# Patient Record
Sex: Male | Born: 1975 | Race: White | Hispanic: No | Marital: Married | Smoking: Never smoker
Health system: Southern US, Community
[De-identification: ages and names within clinical notes are randomized; demographics above are authoritative.]

## PROBLEM LIST (undated history)

## (undated) DIAGNOSIS — I10 Essential (primary) hypertension: Secondary | ICD-10-CM

## (undated) DIAGNOSIS — G4730 Sleep apnea, unspecified: Secondary | ICD-10-CM

## (undated) DIAGNOSIS — G40909 Epilepsy, unspecified, not intractable, without status epilepticus: Secondary | ICD-10-CM

## (undated) HISTORY — DX: Hemochromatosis, unspecified: E83.119

## (undated) HISTORY — DX: Other hemochromatosis: E83.118

## (undated) HISTORY — DX: Essential (primary) hypertension: I10

## (undated) HISTORY — DX: Sleep apnea, unspecified: G47.30

## (undated) HISTORY — DX: Epilepsy, unspecified, not intractable, without status epilepticus: G40.909

## (undated) HISTORY — PX: TONSILLECTOMY: SUR1361

---

## 2019-04-13 ENCOUNTER — Ambulatory Visit: Payer: BC Managed Care – PPO | Admitting: Legal Medicine

## 2019-04-13 ENCOUNTER — Encounter: Payer: Self-pay | Admitting: Legal Medicine

## 2019-04-13 ENCOUNTER — Other Ambulatory Visit: Payer: Self-pay

## 2019-04-13 DIAGNOSIS — R42 Dizziness and giddiness: Secondary | ICD-10-CM | POA: Insufficient documentation

## 2019-04-13 DIAGNOSIS — G4730 Sleep apnea, unspecified: Secondary | ICD-10-CM | POA: Insufficient documentation

## 2019-04-13 DIAGNOSIS — G4733 Obstructive sleep apnea (adult) (pediatric): Secondary | ICD-10-CM | POA: Diagnosis not present

## 2019-04-13 DIAGNOSIS — R072 Precordial pain: Secondary | ICD-10-CM

## 2019-04-13 DIAGNOSIS — R079 Chest pain, unspecified: Secondary | ICD-10-CM

## 2019-04-13 HISTORY — DX: Dizziness and giddiness: R42

## 2019-04-13 HISTORY — DX: Chest pain, unspecified: R07.9

## 2019-04-13 HISTORY — DX: Morbid (severe) obesity due to excess calories: E66.01

## 2019-04-13 MED ORDER — MECLIZINE HCL 25 MG PO TABS: 25.0000 mg | ORAL_TABLET | Freq: Three times a day (TID) | ORAL | 0 refills | Status: DC | PRN

## 2019-04-13 NOTE — Assessment & Plan Note (Signed)
Counseled on detrimental effects of excessive weight on overall health. Counseled to engage in 150 minutes of moderate exercise a week and to reduce caloric intake with a diet of healthy foods that he feels he can be most compliant with. Set goal to reach a weight of 5 pounds in the next one months.

## 2019-04-13 NOTE — Assessment & Plan Note (Signed)
Not been checked in years and may be the cause of his chest pain , fatigue or vertigo.  May need hematology consult.

## 2019-04-13 NOTE — Progress Notes (Signed)
Acute Office Visit  Subjective:    Patient ID: Austin Gilbert, male    DOB: 05/09/1975, 44 y.o.   MRN: [id number]  Chief Complaint  Patient presents with  . Dizziness  . Hypertension    reported blood pressure of 137/95    Dizziness  Hypertension   Patient is in today for vertigo.  Vertigo started off and on for weeks. No tinnitus.  Fell down stairs once.  No  Illness.  No allergies or fever or chills.  Patient presents for follow up of hypertension.  Patient tolerating no well with side effects.  Patient was diagnosed with hypertension few days ago so has been treated for hypertension for < 1 years.Patient is working on maintaining diet and exercise regimen and follows up as directed. BP ws going up and down over week end.  Hemachromatosis not check in long time.  Past Medical History:  Diagnosis Date  . Epilepsy ([place])   . Hemochromatosis     History reviewed. No pertinent surgical history.  Family History  Problem Relation Age of Onset  . Heart disease Mother   . Hypertension Mother   . Hyperlipidemia Mother   . Transient ischemic attack Mother   . COPD Father   . Testicular cancer Brother     Social History   Socioeconomic History  . Marital status: Married    Spouse name: Not on file  . Number of children: Not on file  . Years of education: Not on file  . Highest education level: Not on file  Occupational History    Employer: [employer]  Tobacco Use  . Smoking status: Never Smoker  . Smokeless tobacco: Never Used  Substance and Sexual Activity  . Alcohol use: Not Currently  . Drug use: Not Currently    Types: Marijuana    Comment: Experimented with  . Sexual activity: Not on file  Other Topics Concern  . Not on file  Social History Narrative  . Not on file   Social Determinants of Health   Financial Resource Strain:   . Difficulty of Paying Living Expenses: Not on file  Food Insecurity:   . Worried About Charity fundraiser in the  Last Year: Not on file  . Ran Out of Food in the Last Year: Not on file  Transportation Needs:   . Lack of Transportation (Medical): Not on file  . Lack of Transportation (Non-Medical): Not on file  Physical Activity:   . Days of Exercise per Week: Not on file  . Minutes of Exercise per Session: Not on file  Stress:   . Feeling of Stress : Not on file  Social Connections:   . Frequency of Communication with Friends and Family: Not on file  . Frequency of Social Gatherings with Friends and Family: Not on file  . Attends Religious Services: Not on file  . Active Member of Clubs or Organizations: Not on file  . Attends Archivist Meetings: Not on file  . Marital Status: Not on file  Intimate Partner Violence:   . Fear of Current or Ex-Partner: Not on file  . Emotionally Abused: Not on file  . Physically Abused: Not on file  . Sexually Abused: Not on file    Outpatient Medications Prior to Visit  Medication Sig Dispense Refill  . Ascorbic Acid (VITA-C PO) Take by mouth.    . cetirizine (ZYRTEC) 10 MG chewable tablet Chew 10 mg by mouth daily.    . Multiple Vitamin (  MULTIVITAMIN) tablet Take 1 tablet by mouth daily.    . Multiple Vitamins-Minerals (ZINC PO) Take by mouth.     No facility-administered medications prior to visit.    No Known Allergies  Review of Systems  Constitutional: Negative.   HENT: Negative.   Eyes: Negative.   Respiratory: Negative.   Cardiovascular: Negative.   Gastrointestinal: Negative.   Endocrine: Negative.   Genitourinary: Negative.   Musculoskeletal: Negative.   Neurological: Positive for dizziness.       Objective:    Physical Exam Constitutional:      Appearance: Normal appearance. He is obese.  HENT:     Head: Normocephalic and atraumatic.     Comments: Nystagmus to right Cardiovascular:     Rate and Rhythm: Normal rate and regular rhythm.     Pulses: Normal pulses.     Heart sounds: Normal heart sounds.  Pulmonary:      Effort: Pulmonary effort is normal.     Breath sounds: Normal breath sounds.  Musculoskeletal:        General: Normal range of motion.     Cervical back: Normal range of motion.  Neurological:     Mental Status: He is alert.     Coordination: Romberg sign positive. Finger-Nose-Finger Test normal.     BP 104/60   Pulse 81   Temp 98.2 F (36.8 C)   Resp 18   Ht '5\' 7"'$  (1.702 m)   Wt 297 lb (134.7 kg)   SpO2 98%   BMI 46.52 kg/m  Wt Readings from Last 3 Encounters:  04/13/19 297 lb (134.7 kg)    Health Maintenance Due  Topic Date Due  . HIV Screening  05/03/1988  . TETANUS/TDAP  05/03/1992  . INFLUENZA VACCINE  10/03/2018    There are no preventive care reminders to display for this patient.   No results found for: TSH No results found for: WBC, HGB, HCT, MCV, PLT No results found for: NA, K, CHLORIDE, CO2, GLUCOSE, BUN, CREATININE, BILITOT, ALKPHOS, AST, ALT, PROT, ALBUMIN, CALCIUM, ANIONGAP, EGFR, GFR No results found for: CHOL No results found for: HDL No results found for: LDLCALC No results found for: TRIG No results found for: CHOLHDL No results found for: HGBA1C     Assessment & Plan:   Problem List Items Addressed This Visit      Respiratory   OSA (obstructive sleep apnea)    He has been on CPAP but does not use it regualrly.  He wakes up sleepy and still snores at night.  He requires recheck for appropriate pressures since he has gained weight. Refer to respiratory.      Relevant Orders   Ambulatory referral to Pulmonology     Other   Hemochromatosis    Not been checked in years and may be the cause of his chest pain , fatigue or vertigo.  May need hematology consult.      Relevant Orders   CBC with Differential   Ferritin   Morbid obesity ([place])    Counseled on detrimental effects of excessive weight on overall health. Counseled to engage in 150 minutes of moderate exercise a week and to reduce caloric intake with a diet of healthy foods that  he feels he can be most compliant with. Set goal to reach a weight of 5 pounds in the next one months.       Vertigo    Patient is having vertigo and fluctuation BP.  Positive nystagmus.  Try antevert for now.  Relevant Medications   meclizine (ANTIVERT) 25 MG tablet   Other Relevant Orders   Novel Coronavirus, NAA ([facility])   Chest pain    Patient gets chest discomfort with working and exercising.  No sweats or radiation.  It makes him stop working out.  He needs clearance before endeavoring a formal exercise program.      Relevant Orders   Comprehensive metabolic panel   Lipid Panel       Meds ordered this encounter  Medications  . meclizine (ANTIVERT) 25 MG tablet    Sig: Take 1 tablet (25 mg total) by mouth 3 (three) times daily as needed for dizziness.    Dispense:  30 tablet    Refill:  0     Reinaldo Meeker, MD

## 2019-04-13 NOTE — Assessment & Plan Note (Signed)
Patient is having vertigo and fluctuation BP.  Positive nystagmus.  Try antevert for now.

## 2019-04-13 NOTE — Assessment & Plan Note (Signed)
Patient gets chest discomfort with working and exercising.  No sweats or radiation.  It makes him stop working out.  He needs clearance before endeavoring a formal exercise program.

## 2019-04-13 NOTE — Patient Instructions (Signed)
Fat and Cholesterol Restricted Eating Plan Getting too much fat and cholesterol in your diet may cause health problems. Choosing the right foods helps keep your fat and cholesterol at normal levels. This can keep you from getting certain diseases. Your doctor may recommend an eating plan that includes:  Total fat: ______% or less of total calories a day.  Saturated fat: ______% or less of total calories a day.  Cholesterol: less than _________mg a day.  Fiber: ______g a day. What are tips for following this plan? Meal planning  At meals, divide your plate into four equal parts: ? Fill one-half of your plate with vegetables and green salads. ? Fill one-fourth of your plate with whole grains. ? Fill one-fourth of your plate with low-fat (lean) protein foods.  Eat fish that is high in omega-3 fats at least two times a week. This includes mackerel, tuna, sardines, and salmon.  Eat foods that are high in fiber, such as whole grains, beans, apples, broccoli, carrots, peas, and barley. General tips   Work with your doctor to lose weight if you need to.  Avoid: ? Foods with added sugar. ? Fried foods. ? Foods with partially hydrogenated oils.  Limit alcohol intake to no more than 1 drink a day for nonpregnant women and 2 drinks a day for men. One drink equals 12 oz of beer, 5 oz of wine, or 1 oz of hard liquor. Reading food labels  Check food labels for: ? Trans fats. ? Partially hydrogenated oils. ? Saturated fat (g) in each serving. ? Cholesterol (mg) in each serving. ? Fiber (g) in each serving.  Choose foods with healthy fats, such as: ? Monounsaturated fats. ? Polyunsaturated fats. ? Omega-3 fats.  Choose grain products that have whole grains. Look for the word "whole" as the first word in the ingredient list. Cooking  Cook foods using low-fat methods. These include baking, boiling, grilling, and broiling.  Eat more home-cooked foods. Eat at restaurants and buffets  less often.  Avoid cooking using saturated fats, such as butter, cream, palm oil, palm kernel oil, and coconut oil. Recommended foods  Fruits  All fresh, canned (in natural juice), or frozen fruits. Vegetables  Fresh or frozen vegetables (raw, steamed, roasted, or grilled). Green salads. Grains  Whole grains, such as whole wheat or whole grain breads, crackers, cereals, and pasta. Unsweetened oatmeal, bulgur, barley, quinoa, or brown rice. Corn or whole wheat flour tortillas. Meats and other protein foods  Ground beef (85% or leaner), grass-fed beef, or beef trimmed of fat. Skinless chicken or turkey. Ground chicken or turkey. Pork trimmed of fat. All fish and seafood. Egg whites. Dried beans, peas, or lentils. Unsalted nuts or seeds. Unsalted canned beans. Nut butters without added sugar or oil. Dairy  Low-fat or nonfat dairy products, such as skim or 1% milk, 2% or reduced-fat cheeses, low-fat and fat-free ricotta or cottage cheese, or plain low-fat and nonfat yogurt. Fats and oils  Tub margarine without trans fats. Light or reduced-fat mayonnaise and salad dressings. Avocado. Olive, canola, sesame, or safflower oils. The items listed above may not be a complete list of foods and beverages you can eat. Contact a dietitian for more information. Foods to avoid Fruits  Canned fruit in heavy syrup. Fruit in cream or butter sauce. Fried fruit. Vegetables  Vegetables cooked in cheese, cream, or butter sauce. Fried vegetables. Grains  White bread. White pasta. White rice. Cornbread. Bagels, pastries, and croissants. Crackers and snack foods that contain trans fat   and hydrogenated oils. Meats and other protein foods  Fatty cuts of meat. Ribs, chicken wings, bacon, sausage, bologna, salami, chitterlings, fatback, hot dogs, bratwurst, and packaged lunch meats. Liver and organ meats. Whole eggs and egg yolks. Chicken and turkey with skin. Fried meat. Dairy  Whole or 2% milk, cream,  half-and-half, and cream cheese. Whole milk cheeses. Whole-fat or sweetened yogurt. Full-fat cheeses. Nondairy creamers and whipped toppings. Processed cheese, cheese spreads, and cheese curds. Beverages  Alcohol. Sugar-sweetened drinks such as sodas, lemonade, and fruit drinks. Fats and oils  Butter, stick margarine, lard, shortening, ghee, or bacon fat. Coconut, palm kernel, and palm oils. Sweets and desserts  Corn syrup, sugars, honey, and molasses. Candy. Jam and jelly. Syrup. Sweetened cereals. Cookies, pies, cakes, donuts, muffins, and ice cream. The items listed above may not be a complete list of foods and beverages you should avoid. Contact a dietitian for more information. Summary  Choosing the right foods helps keep your fat and cholesterol at normal levels. This can keep you from getting certain diseases.  At meals, fill one-half of your plate with vegetables and green salads.  Eat high-fiber foods, like whole grains, beans, apples, carrots, peas, and barley.  Limit added sugar, saturated fats, alcohol, and fried foods. This information is not intended to replace advice given to you by your health care provider. Make sure you discuss any questions you have with your health care provider. Document Revised: 10/22/2017 Document Reviewed: 11/05/2016 Elsevier Patient Education  2020 Elsevier Inc.  

## 2019-04-13 NOTE — Assessment & Plan Note (Signed)
He has been on CPAP but does not use it regualrly.  He wakes up sleepy and still snores at night.  He requires recheck for appropriate pressures since he has gained weight. Refer to respiratory.

## 2019-04-14 ENCOUNTER — Other Ambulatory Visit: Payer: Self-pay | Admitting: Legal Medicine

## 2019-04-14 LAB — CBC WITH DIFFERENTIAL/PLATELET
Basophils Absolute: 0 10*3/uL (ref 0.0–0.2)
Basos: 0 %
EOS (ABSOLUTE): 0.3 10*3/uL (ref 0.0–0.4)
Eos: 3 %
Hematocrit: 42.1 % (ref 37.5–51.0)
Hemoglobin: 14.6 g/dL (ref 13.0–17.7)
Immature Grans (Abs): 0 10*3/uL (ref 0.0–0.1)
Immature Granulocytes: 0 %
Lymphocytes Absolute: 2.7 10*3/uL (ref 0.7–3.1)
Lymphs: 23 %
MCH: 32.4 pg (ref 26.6–33.0)
MCHC: 34.7 g/dL (ref 31.5–35.7)
MCV: 93 fL (ref 79–97)
Monocytes Absolute: 0.9 10*3/uL (ref 0.1–0.9)
Monocytes: 8 %
Neutrophils Absolute: 7.6 10*3/uL — ABNORMAL HIGH (ref 1.4–7.0)
Neutrophils: 66 %
Platelets: 284 10*3/uL (ref 150–450)
RBC: 4.51 x10E6/uL (ref 4.14–5.80)
RDW: 12.8 % (ref 11.6–15.4)
WBC: 11.6 10*3/uL — ABNORMAL HIGH (ref 3.4–10.8)

## 2019-04-14 LAB — COMPREHENSIVE METABOLIC PANEL
ALT: 36 IU/L (ref 0–44)
AST: 27 IU/L (ref 0–40)
Albumin/Globulin Ratio: 1.7 (ref 1.2–2.2)
Albumin: 4.4 g/dL (ref 4.0–5.0)
Alkaline Phosphatase: 94 IU/L (ref 39–117)
BUN/Creatinine Ratio: 17 (ref 9–20)
BUN: 15 mg/dL (ref 6–24)
Bilirubin Total: 0.2 mg/dL (ref 0.0–1.2)
CO2: 24 mmol/L (ref 20–29)
Calcium: 9.2 mg/dL (ref 8.7–10.2)
Chloride: 102 mmol/L (ref 96–106)
Creatinine, Ser: 0.9 mg/dL (ref 0.76–1.27)
GFR calc Af Amer: 119 mL/min/{1.73_m2} (ref >59)
GFR calc non Af Amer: 103 mL/min/{1.73_m2} (ref >59)
Globulin, Total: 2.6 g/dL (ref 1.5–4.5)
Glucose: 87 mg/dL (ref 65–99)
Potassium: 4.3 mmol/L (ref 3.5–5.2)
Sodium: 138 mmol/L (ref 134–144)
Total Protein: 7 g/dL (ref 6.0–8.5)

## 2019-04-14 LAB — LIPID PANEL
Chol/HDL Ratio: 3.6 ratio (ref 0.0–5.0)
Cholesterol, Total: 153 mg/dL (ref 100–199)
HDL: 42 mg/dL (ref >39)
LDL Chol Calc (NIH): 85 mg/dL (ref 0–99)
Triglycerides: 150 mg/dL — ABNORMAL HIGH (ref 0–149)
VLDL Cholesterol Cal: 26 mg/dL (ref 5–40)

## 2019-04-14 LAB — CARDIOVASCULAR RISK ASSESSMENT

## 2019-04-14 LAB — FERRITIN: Ferritin: 552 ng/mL — ABNORMAL HIGH (ref 30–400)

## 2019-04-14 LAB — NOVEL CORONAVIRUS, NAA: SARS-CoV-2, NAA: NOT DETECTED

## 2019-04-14 NOTE — Progress Notes (Signed)
Wbc high ? Infection, no anemia, ferritin high 552, kidney and liver tests normal, triglycerides are high- low cholesterol diet. Send patient one.  I can send to [facility] for the high storage of iron? lp

## 2019-04-14 NOTE — Progress Notes (Signed)
SARS-CoV-2 is negative lp

## 2019-04-16 ENCOUNTER — Other Ambulatory Visit: Payer: Self-pay

## 2019-04-16 ENCOUNTER — Ambulatory Visit (INDEPENDENT_AMBULATORY_CARE_PROVIDER_SITE_OTHER): Payer: BC Managed Care – PPO | Admitting: Cardiology

## 2019-04-16 ENCOUNTER — Encounter: Payer: Self-pay | Admitting: Cardiology

## 2019-04-16 VITALS — BP 138/88 | HR 78 | Temp 97.3°F | Ht 67.0 in | Wt 295.8 lb

## 2019-04-16 DIAGNOSIS — G4733 Obstructive sleep apnea (adult) (pediatric): Secondary | ICD-10-CM | POA: Diagnosis not present

## 2019-04-16 DIAGNOSIS — R0600 Dyspnea, unspecified: Secondary | ICD-10-CM

## 2019-04-16 DIAGNOSIS — R0609 Other forms of dyspnea: Secondary | ICD-10-CM

## 2019-04-16 DIAGNOSIS — I10 Essential (primary) hypertension: Secondary | ICD-10-CM | POA: Insufficient documentation

## 2019-04-16 HISTORY — DX: Essential (primary) hypertension: I10

## 2019-04-16 HISTORY — DX: Other forms of dyspnea: R06.09

## 2019-04-16 MED ORDER — AMLODIPINE BESYLATE 5 MG PO TABS: 5.0000 mg | ORAL_TABLET | Freq: Every day | ORAL | 12 refills | Status: DC

## 2019-04-16 NOTE — Progress Notes (Signed)
Cardiology Office Note:    Date:  04/16/2019   ID:  Austin Gilbert, DOB [birth year], MRN [id number]  PCP:  Austin Anes, MD  Cardiologist:  Austin Lindau, MD   Referring MD: Austin Gilbert,*    ASSESSMENT:    1. OSA (obstructive sleep apnea)   2. Morbid obesity ([place])   3. Hemochromatosis, unspecified hemochromatosis type   4. Essential hypertension    PLAN:    In order of problems listed above:  1. Primary prevention stressed with the patient.  Importance of compliance with diet and medication stressed and he vocalized understanding 2. Essential hypertension: Blood pressure is elevated and I will initiate him on Norvasc 5 mg daily.  Diet especially salt issues were discussed.  I told him not to exercise very vigorously in the next few days till his blood pressure normalizes 3. Dyspnea on exertion: We will do a Lexiscan sestamibi to see if there is any concern here especially of coronary artery disease. 4. Morbid obesity: Diet was discussed and he promises to do better and lose weight.  Once his blood pressure normalizes and his stress test was fine we will okay him for an exercise protocol. Sleep health issues were discussed with the patient at length Follow-up appointment in a month or earlier if he has any concerns.  He knows to go to the nearest emergency room for any concerning symptoms.   Medication Adjustments/Labs and Tests Ordered: Current medicines are reviewed at length with the patient today.  Concerns regarding medicines are outlined above.  No orders of the defined types were placed in this encounter.  No orders of the defined types were placed in this encounter.    History of Present Illness:    Austin Gilbert is a 44 y.o. male who is being seen today for the evaluation of essential hypertension at the request of Austin Gilbert,*.  Patient is a pleasant 44 year old male.  He has past medical history of morbid obesity, sleep apnea and  hemochromatosis.  He mentions to me that his blood pressure is elevated and he is here for evaluation.  His wife is in the healthcare field and his mother-in-law is a Marine scientist and therefore they are seeking that he take proper care and attention for his hypertension.  He is morbidly obese.  He tries to live an active lifestyle but he is put a hold on his exercise because of elevated blood pressure.  He denies any chest pain orthopnea or PND.  Just some dyspnea on exertion.  At the time of my evaluation, the patient is alert awake oriented and in no distress.  Past Medical History:  Diagnosis Date  . Epilepsy (Austin Gilbert)   . Hemochromatosis     No past surgical history on file.  Current Medications: Current Meds  Medication Sig  . Ascorbic Acid (VITA-C PO) Take by mouth.  . cetirizine (ZYRTEC) 10 MG chewable tablet Chew 10 mg by mouth daily.  . meclizine (ANTIVERT) 25 MG tablet Take 1 tablet (25 mg total) by mouth 3 (three) times daily as needed for dizziness.  . Multiple Vitamin (MULTIVITAMIN) tablet Take 1 tablet by mouth daily.  . Multiple Vitamins-Minerals (ZINC PO) Take by mouth.     Allergies:   Patient has no known allergies.   Social History   Socioeconomic History  . Marital status: Married    Spouse name: Not on file  . Number of children: Not on file  . Years of education: Not on file  .  Highest education level: Not on file  Occupational History    Employer: [employer]  Tobacco Use  . Smoking status: Never Smoker  . Smokeless tobacco: Never Used  Substance and Sexual Activity  . Alcohol use: Not Currently  . Drug use: Not Currently    Types: Marijuana    Comment: Experimented with  . Sexual activity: Not on file  Other Topics Concern  . Not on file  Social History Narrative  . Not on file   Social Determinants of Health   Financial Resource Strain:   . Difficulty of Paying Living Expenses: Not on file  Food Insecurity:   . Worried About Sales executive in the Last Year: Not on file  . Ran Out of Food in the Last Year: Not on file  Transportation Needs:   . Lack of Transportation (Medical): Not on file  . Lack of Transportation (Non-Medical): Not on file  Physical Activity:   . Days of Exercise per Week: Not on file  . Minutes of Exercise per Session: Not on file  Stress:   . Feeling of Stress : Not on file  Social Connections:   . Frequency of Communication with Friends and Family: Not on file  . Frequency of Social Gatherings with Friends and Family: Not on file  . Attends Religious Services: Not on file  . Active Member of Clubs or Organizations: Not on file  . Attends Archivist Meetings: Not on file  . Marital Status: Not on file     Family History: The patient's family history includes COPD in his father; Heart disease in his mother; Hyperlipidemia in his mother; Hypertension in his mother; Testicular cancer in his brother; Transient ischemic attack in his mother.  ROS:   Please see the history of present illness.    All other systems reviewed and are negative.  EKGs/Labs/Other Studies Reviewed:    The following studies were reviewed today: EKG reveals sinus rhythm and nonspecific ST-T changes.  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  Recent Labs: 04/13/2019: ALT 36; BUN 15; Creatinine, Ser 0.90; Hemoglobin 14.6; Platelets 284; Potassium 4.3; Sodium 138  Recent Lipid Panel    Component Value Date/Time   CHOL 153 04/13/2019 1600   TRIG 150 (H) 04/13/2019 1600   HDL 42 04/13/2019 1600   CHOLHDL 3.6 04/13/2019 1600   LDLCALC 85 04/13/2019 1600    Physical Exam:    VS:  BP 138/88   Pulse 78   Temp (!) 97.3 F (36.3 C)   Ht 5\' 7"  (1.702 m)   Wt 295 lb 12.8 oz (134.2 kg)   SpO2 99%   BMI 46.33 kg/m     Wt Readings from Last 3 Encounters:  04/16/19 295 lb 12.8 oz (134.2 kg)  04/13/19 297 lb (134.7 kg)     GEN: Patient is in no acute distress HEENT: Normal NECK: No  JVD; No carotid bruits LYMPHATICS: No lymphadenopathy CARDIAC: S1 S2 regular, 2/6 systolic murmur at the apex. RESPIRATORY:  Clear to auscultation without rales, wheezing or rhonchi  ABDOMEN: Soft, non-tender, non-distended MUSCULOSKELETAL:  No edema; No deformity  SKIN: Warm and dry NEUROLOGIC:  Alert and oriented x 3 PSYCHIATRIC:  Normal affect    Signed, Austin Lindau, MD  04/16/2019 10:22 AM    [facility]

## 2019-04-16 NOTE — Patient Instructions (Signed)
Medication Instructions:  Your physician has recommended you make the following change in your medication:  Start Norvasc 5 mg daily.  *If you need a refill on your cardiac medications before your next appointment, please call your pharmacy*  Lab Work: None ordered  If you have labs (blood work) drawn today and your tests are completely normal, you will receive your results only by: Marland Kitchen MyChart Message (if you have MyChart) OR . A paper copy in the mail If you have any lab test that is abnormal or we need to change your treatment, we will call you to review the results.  Testing/Procedures: Your physician has requested that you have an echocardiogram. Echocardiography is a painless test that uses sound waves to create images of your heart. It provides your doctor with information about the size and shape of your heart and how well your heart's chambers and valves are working. This procedure takes approximately one hour. There are no restrictions for this procedure.  Your physician has requested that you have a lexiscan myoview. For further information please visit [email or web address]. Please follow instruction sheet, as given.   Follow-Up: At [facility], you and your health needs are our priority.  As part of our continuing mission to provide you with exceptional heart care, we have created designated Provider Care Teams.  These Care Teams include your primary Cardiologist (physician) and Advanced Practice Providers (APPs -  Physician Assistants and Nurse Practitioners) who all work together to provide you with the care you need, when you need it.  Your next appointment:   1 month(s)  The format for your next appointment:   In Person  Provider:   Jyl Heinz, MD  Other Instructions  Echocardiogram An echocardiogram is a procedure that uses painless sound waves (ultrasound) to produce an image of the heart. Images from an echocardiogram can provide important information  about:  Signs of coronary artery disease (CAD).  Aneurysm detection. An aneurysm is a weak or damaged part of an artery wall that bulges out from the normal force of blood pumping through the body.  Heart size and shape. Changes in the size or shape of the heart can be associated with certain conditions, including heart failure, aneurysm, and CAD.  Heart muscle function.  Heart valve function.  Signs of a past heart attack.  Fluid buildup around the heart.  Thickening of the heart muscle.  A tumor or infectious growth around the heart valves. Tell a health care provider about:  Any allergies you have.  All medicines you are taking, including vitamins, herbs, eye drops, creams, and over-the-counter medicines.  Any blood disorders you have.  Any surgeries you have had.  Any medical conditions you have.  Whether you are pregnant or may be pregnant. What are the risks? Generally, this is a safe procedure. However, problems may occur, including:  Allergic reaction to dye (contrast) that may be used during the procedure. What happens before the procedure? No specific preparation is needed. You may eat and drink normally. What happens during the procedure?   An IV tube may be inserted into one of your veins.  You may receive contrast through this tube. A contrast is an injection that improves the quality of the pictures from your heart.  A gel will be applied to your chest.  A wand-like tool (transducer) will be moved over your chest. The gel will help to transmit the sound waves from the transducer.  The sound waves will harmlessly bounce off of your  heart to allow the heart images to be captured in real-time motion. The images will be recorded on a computer. The procedure may vary among health care providers and hospitals. What happens after the procedure?  You may return to your normal, everyday life, including diet, activities, and medicines, unless your health care  provider tells you not to do that. Summary  An echocardiogram is a procedure that uses painless sound waves (ultrasound) to produce an image of the heart.  Images from an echocardiogram can provide important information about the size and shape of your heart, heart muscle function, heart valve function, and fluid buildup around your heart.  You do not need to do anything to prepare before this procedure. You may eat and drink normally.  After the echocardiogram is completed, you may return to your normal, everyday life, unless your health care provider tells you not to do that. This information is not intended to replace advice given to you by your health care provider. Make sure you discuss any questions you have with your health care provider. Document Revised: 06/11/2018 Document Reviewed: 03/23/2016 Elsevier Patient Education  [place].

## 2019-05-12 ENCOUNTER — Telehealth: Payer: Self-pay | Admitting: *Deleted

## 2019-05-12 NOTE — Telephone Encounter (Signed)
Patient given detailed instructions per Myocardial Perfusion Study Information Sheet for the test on 05/18/2019 at 0745.Patient informed to be here at 0800 due to change to a 2day/ Patient notified to arrive 15 minutes early and that it is imperative to arrive on time for appointment to keep from having the test rescheduled.  If you need to cancel or reschedule your appointment, please call the office within 24 hours of your appointment. . Patient verbalized understanding.Austin Gilbert, [facility]ana[facility] Palms'  No mychart available

## 2019-05-18 ENCOUNTER — Ambulatory Visit: Payer: BC Managed Care – PPO

## 2019-05-20 ENCOUNTER — Ambulatory Visit: Payer: BC Managed Care – PPO

## 2019-05-31 ENCOUNTER — Other Ambulatory Visit: Payer: Self-pay

## 2019-05-31 ENCOUNTER — Ambulatory Visit (INDEPENDENT_AMBULATORY_CARE_PROVIDER_SITE_OTHER): Payer: BC Managed Care – PPO

## 2019-05-31 DIAGNOSIS — R0600 Dyspnea, unspecified: Secondary | ICD-10-CM | POA: Diagnosis not present

## 2019-05-31 DIAGNOSIS — I10 Essential (primary) hypertension: Secondary | ICD-10-CM | POA: Diagnosis not present

## 2019-05-31 NOTE — Progress Notes (Signed)
Complete echocardiogram has been performed.  Jimmy Takeyah Wieman RDCS, RVT 

## 2019-06-08 DIAGNOSIS — Z6841 Body Mass Index (BMI) 40.0 and over, adult: Secondary | ICD-10-CM

## 2019-06-08 HISTORY — DX: Body Mass Index (BMI) 40.0 and over, adult: Z684

## 2019-06-09 ENCOUNTER — Telehealth (HOSPITAL_COMMUNITY): Payer: Self-pay | Admitting: *Deleted

## 2019-06-09 NOTE — Telephone Encounter (Signed)
Patient given detailed instructions per Myocardial Perfusion Study Information Sheet for the test on 06/15/19 Patient notified to arrive 15 minutes early and that it is imperative to arrive on time for appointment to keep from having the test rescheduled.  If you need to cancel or reschedule your appointment, please call the office within 24 hours of your appointment. . Patient verbalized understanding. Austin Gilbert

## 2019-06-15 ENCOUNTER — Other Ambulatory Visit: Payer: Self-pay

## 2019-06-15 ENCOUNTER — Ambulatory Visit (INDEPENDENT_AMBULATORY_CARE_PROVIDER_SITE_OTHER): Payer: BC Managed Care – PPO

## 2019-06-15 VITALS — Ht 67.0 in | Wt 295.0 lb

## 2019-06-15 DIAGNOSIS — R0600 Dyspnea, unspecified: Secondary | ICD-10-CM | POA: Diagnosis not present

## 2019-06-15 DIAGNOSIS — R079 Chest pain, unspecified: Secondary | ICD-10-CM

## 2019-06-15 MED ORDER — TECHNETIUM TC 99M TETROFOSMIN IV KIT
31.2000 | PACK | Freq: Once | INTRAVENOUS | Status: AC | PRN
  Administered 2019-06-15: 31.2 via INTRAVENOUS

## 2019-06-15 MED ORDER — REGADENOSON 0.4 MG/5ML IV SOLN
0.4000 mg | Freq: Once | INTRAVENOUS | Status: AC
  Administered 2019-06-15: 0.4 mg via INTRAVENOUS

## 2019-06-15 NOTE — Progress Notes (Unsigned)
Patient given printout of Q and A from [facility] in Radiation Protection after concerns of radiation exposure to his infant after MPI.

## 2019-06-17 ENCOUNTER — Ambulatory Visit: Payer: BC Managed Care – PPO

## 2019-06-17 ENCOUNTER — Other Ambulatory Visit: Payer: Self-pay

## 2019-06-17 LAB — MYOCARDIAL PERFUSION IMAGING
LV dias vol: 91 mL (ref 62–150)
LV sys vol: 22 mL
Peak HR: 103 {beats}/min
Rest HR: 78 {beats}/min
SDS: 2
SRS: 0
SSS: 2
TID: 0.96

## 2019-06-17 MED ORDER — TECHNETIUM TC 99M TETROFOSMIN IV KIT
32.2000 | PACK | Freq: Once | INTRAVENOUS | Status: AC | PRN
  Administered 2019-06-17: 32.2 via INTRAVENOUS

## 2019-06-24 ENCOUNTER — Other Ambulatory Visit: Payer: Self-pay

## 2019-06-24 ENCOUNTER — Encounter: Payer: Self-pay | Admitting: Legal Medicine

## 2019-06-24 ENCOUNTER — Ambulatory Visit: Payer: BC Managed Care – PPO | Admitting: Legal Medicine

## 2019-06-24 VITALS — BP 130/84 | HR 73 | Temp 97.0°F | Resp 16 | Ht 66.0 in | Wt 289.8 lb

## 2019-06-24 DIAGNOSIS — E291 Testicular hypofunction: Secondary | ICD-10-CM

## 2019-06-24 DIAGNOSIS — R5383 Other fatigue: Secondary | ICD-10-CM | POA: Insufficient documentation

## 2019-06-24 HISTORY — DX: Testicular hypofunction: E29.1

## 2019-06-24 HISTORY — DX: Other fatigue: R53.83

## 2019-06-24 NOTE — Assessment & Plan Note (Signed)
Patient has a history of hypogonadism in past in [place] but never got treatment.  He says he has fatigue all the time.  He did father a child last year.  Check testosterone

## 2019-06-24 NOTE — Progress Notes (Signed)
Established Patient Office Visit  Subjective:  Patient ID: Austin Gilbert, male    DOB: [birth year]  Age: 44 y.o. MRN: [id number]  CC:  Chief Complaint  Patient presents with  . Fatigue    HPI Austin Gilbert presents for low libdo and fatigue for extended time.  No ED.  He has fathered children.  He had Low testosterone in [place] when he and wife was going for fertility counseling.  He has trouble maintaining an erection.  Past Medical History:  Diagnosis Date  . Epilepsy ([place])   . Hemochromatosis   . Other hemochromatosis     History reviewed. No pertinent surgical history.  Family History  Problem Relation Age of Onset  . Heart disease Mother   . Hypertension Mother   . Hyperlipidemia Mother   . Transient ischemic attack Mother   . COPD Father   . Testicular cancer Brother     Social History   Socioeconomic History  . Marital status: Married    Spouse name: Not on file  . Number of children: Not on file  . Years of education: Not on file  . Highest education level: Not on file  Occupational History    Employer: [employer]  Tobacco Use  . Smoking status: Never Smoker  . Smokeless tobacco: Never Used  Substance and Sexual Activity  . Alcohol use: Not Currently  . Drug use: Not Currently    Types: Marijuana    Comment: Experimented with  . Sexual activity: Not on file  Other Topics Concern  . Not on file  Social History Narrative  . Not on file   Social Determinants of Health   Financial Resource Strain:   . Difficulty of Paying Living Expenses:   Food Insecurity:   . Worried About Charity fundraiser in the Last Year:   . Arboriculturist in the Last Year:   Transportation Needs:   . Film/video editor (Medical):   Marland Kitchen Lack of Transportation (Non-Medical):   Physical Activity:   . Days of Exercise per Week:   . Minutes of Exercise per Session:   Stress:   . Feeling of Stress :   Social Connections:   . Frequency of  Communication with Friends and Family:   . Frequency of Social Gatherings with Friends and Family:   . Attends Religious Services:   . Active Member of Clubs or Organizations:   . Attends Archivist Meetings:   Marland Kitchen Marital Status:   Intimate Partner Violence:   . Fear of Current or Ex-Partner:   . Emotionally Abused:   Marland Kitchen Physically Abused:   . Sexually Abused:     Outpatient Medications Prior to Visit  Medication Sig Dispense Refill  . amLODipine (NORVASC) 5 MG tablet Take 1 tablet (5 mg total) by mouth daily. 30 tablet 12  . cetirizine (ZYRTEC) 10 MG chewable tablet Chew 10 mg by mouth daily.    . Multiple Vitamin (MULTIVITAMIN) tablet Take 1 tablet by mouth daily.    . Ascorbic Acid (VITA-C PO) Take by mouth.    . meclizine (ANTIVERT) 25 MG tablet Take 1 tablet (25 mg total) by mouth 3 (three) times daily as needed for dizziness. 30 tablet 0  . Multiple Vitamins-Minerals (ZINC PO) Take by mouth.     No facility-administered medications prior to visit.    No Known Allergies  ROS Review of Systems  Constitutional: Negative.   HENT: Negative.   Eyes: Negative.  Respiratory: Negative.   Cardiovascular: Negative.   Gastrointestinal: Negative.   Endocrine: Negative.   Genitourinary: Negative.   Musculoskeletal: Negative.   Neurological: Negative.   Psychiatric/Behavioral: Negative.       Objective:    Physical Exam  Constitutional: He is oriented to person, place, and time. He appears well-developed and well-nourished.  Cardiovascular: Normal rate, regular rhythm, normal heart sounds and intact distal pulses.  Pulmonary/Chest: Effort normal and breath sounds normal.  Abdominal: Soft. Bowel sounds are normal.  Genitourinary:    Genitourinary Comments: Atrophic testicles   Musculoskeletal:        General: Normal range of motion.  Neurological: He is alert and oriented to person, place, and time. He has normal reflexes.  Skin: Skin is warm and dry.    Psychiatric: He has a normal mood and affect. His behavior is normal.  Vitals reviewed.   BP 130/84   Pulse 73   Temp (!) 97 F (36.1 C)   Resp 16   Ht 5\' 6"  (1.676 m)   Wt 289 lb 12.8 oz (131.5 kg)   SpO2 95%   BMI 46.77 kg/m  Wt Readings from Last 3 Encounters:  06/24/19 289 lb 12.8 oz (131.5 kg)  06/15/19 295 lb (133.8 kg)  04/16/19 295 lb 12.8 oz (134.2 kg)     Health Maintenance Due  Topic Date Due  . HIV Screening  Never done  . COVID-19 Vaccine (1) Never done  . TETANUS/TDAP  Never done    There are no preventive care reminders to display for this patient.  No results found for: TSH Lab Results  Component Value Date   WBC 11.6 (H) 04/13/2019   HGB 14.6 04/13/2019   HCT 42.1 04/13/2019   MCV 93 04/13/2019   PLT 284 04/13/2019   Lab Results  Component Value Date   NA 138 04/13/2019   K 4.3 04/13/2019   CO2 24 04/13/2019   GLUCOSE 87 04/13/2019   BUN 15 04/13/2019   CREATININE 0.90 04/13/2019   BILITOT 0.2 04/13/2019   ALKPHOS 94 04/13/2019   AST 27 04/13/2019   ALT 36 04/13/2019   PROT 7.0 04/13/2019   ALBUMIN 4.4 04/13/2019   CALCIUM 9.2 04/13/2019   Lab Results  Component Value Date   CHOL 153 04/13/2019   Lab Results  Component Value Date   HDL 42 04/13/2019   Lab Results  Component Value Date   LDLCALC 85 04/13/2019   Lab Results  Component Value Date   TRIG 150 (H) 04/13/2019   Lab Results  Component Value Date   CHOLHDL 3.6 04/13/2019   No results found for: HGBA1C    Assessment & Plan:   Problem List Items Addressed This Visit      Endocrine   Hypogonadism in male - Primary    Patient has a history of hypogonadism in past in [place] but never got treatment.  He says he has fatigue all the time.  He did father a child last year.  Check testosterone      Relevant Orders   Testosterone Total,Free,Bio, Males     Other   Other hemochromatosis    Treated at outside physician.      Relevant Orders   CBC with  Differential   Fatigue    Fatigue is chronic fr years according to wife.  He probably has hypogonadism      Relevant Orders   TSH      No orders of the defined types were placed  in this encounter.   Follow-up: Return in about 3 months (around 09/23/2019).    Reinaldo Meeker, MD

## 2019-06-24 NOTE — Assessment & Plan Note (Signed)
Treated at outside physician.

## 2019-06-24 NOTE — Assessment & Plan Note (Signed)
Fatigue is chronic fr years according to wife.  He probably has hypogonadism

## 2019-06-25 LAB — CBC WITH DIFFERENTIAL/PLATELET
Basophils Absolute: 0 10*3/uL (ref 0.0–0.2)
Basos: 0 %
EOS (ABSOLUTE): 0.3 10*3/uL (ref 0.0–0.4)
Eos: 3 %
Hematocrit: 43.2 % (ref 37.5–51.0)
Hemoglobin: 14.5 g/dL (ref 13.0–17.7)
Immature Grans (Abs): 0 10*3/uL (ref 0.0–0.1)
Immature Granulocytes: 0 %
Lymphocytes Absolute: 2.8 10*3/uL (ref 0.7–3.1)
Lymphs: 27 %
MCH: 31.7 pg (ref 26.6–33.0)
MCHC: 33.6 g/dL (ref 31.5–35.7)
MCV: 94 fL (ref 79–97)
Monocytes Absolute: 0.9 10*3/uL (ref 0.1–0.9)
Monocytes: 8 %
Neutrophils Absolute: 6.3 10*3/uL (ref 1.4–7.0)
Neutrophils: 62 %
Platelets: 258 10*3/uL (ref 150–450)
RBC: 4.58 x10E6/uL (ref 4.14–5.80)
RDW: 12.7 % (ref 11.6–15.4)
WBC: 10.2 10*3/uL (ref 3.4–10.8)

## 2019-06-25 LAB — TSH: TSH: 4.48 u[IU]/mL (ref 0.450–4.500)

## 2019-06-25 NOTE — Progress Notes (Signed)
CBC normal, TSH normal lp

## 2019-07-01 HISTORY — PX: PORT A CATH INJECTION (ARMC HX): HXRAD1731

## 2019-09-24 ENCOUNTER — Ambulatory Visit (INDEPENDENT_AMBULATORY_CARE_PROVIDER_SITE_OTHER): Payer: Managed Care, Other (non HMO) | Admitting: Legal Medicine

## 2019-09-24 ENCOUNTER — Other Ambulatory Visit: Payer: Self-pay

## 2019-09-24 ENCOUNTER — Encounter: Payer: Self-pay | Admitting: Legal Medicine

## 2019-09-24 DIAGNOSIS — G40909 Epilepsy, unspecified, not intractable, without status epilepticus: Secondary | ICD-10-CM

## 2019-09-24 DIAGNOSIS — G4733 Obstructive sleep apnea (adult) (pediatric): Secondary | ICD-10-CM

## 2019-09-24 DIAGNOSIS — I10 Essential (primary) hypertension: Secondary | ICD-10-CM | POA: Diagnosis not present

## 2019-09-24 DIAGNOSIS — E291 Testicular hypofunction: Secondary | ICD-10-CM | POA: Diagnosis not present

## 2019-09-24 MED ORDER — AMLODIPINE BESYLATE 5 MG PO TABS: 5.0000 mg | ORAL_TABLET | Freq: Every day | ORAL | 2 refills | Status: DC

## 2019-09-24 NOTE — Progress Notes (Signed)
Subjective:  Patient ID: Austin Gilbert, male    DOB: [birth year]  Age: 44 y.o. MRN: [id number]  Chief Complaint  Patient presents with   Hypertension   Obesity    HPI: chronic visit  Patient presents for follow up of hypertension.  Patient tolerating amlodipine well with side effects.  Patient was diagnosed with hypertension 2010 so has been treated for hypertension for 10 years.Patient is working on maintaining diet and exercise regimen and follows up as directed. Complication include none.  Patient is obese and needs weight loss Counseled on detrimental effects of excessive weight on overall health. Counseled to engage in 150 minutes of moderate exercise a week and to reduce caloric intake with a diet of healthy foods that he feels he can be most compliant with. Set goal to reach a weight of 10 pounds in the next 3 months.    Current Outpatient Medications on File Prior to Visit  Medication Sig Dispense Refill   cetirizine (ZYRTEC) 10 MG chewable tablet Chew 10 mg by mouth daily.     Multiple Vitamin (MULTIVITAMIN) tablet Take 1 tablet by mouth daily.     No current facility-administered medications on file prior to visit.   Past Medical History:  Diagnosis Date   Epilepsy ([place])    Hemochromatosis    Other hemochromatosis    History reviewed. No pertinent surgical history.  Family History  Problem Relation Age of Onset   Heart disease Mother    Hypertension Mother    Hyperlipidemia Mother    Transient ischemic attack Mother    COPD Father    Testicular cancer Brother    Social History   Socioeconomic History   Marital status: Married    Spouse name: Not on file   Number of children: Not on file   Years of education: Not on file   Highest education level: Not on file  Occupational History    Employer: [employer]  Tobacco Use   Smoking status: Never Smoker   Smokeless tobacco: Never Used  Scientific laboratory technician Use: Never used    Substance and Sexual Activity   Alcohol use: Not Currently   Drug use: Not Currently    Types: Marijuana    Comment: Experimented with   Sexual activity: Not on file  Other Topics Concern   Not on file  Social History Narrative   Not on file   Social Determinants of Health   Financial Resource Strain:    Difficulty of Paying Living Expenses:   Food Insecurity:    Worried About Charity fundraiser in the Last Year:    Arboriculturist in the Last Year:   Transportation Needs:    Film/video editor (Medical):    Lack of Transportation (Non-Medical):   Physical Activity:    Days of Exercise per Week:    Minutes of Exercise per Session:   Stress:    Feeling of Stress :   Social Connections:    Frequency of Communication with Friends and Family:    Frequency of Social Gatherings with Friends and Family:    Attends Religious Services:    Active Member of Clubs or Organizations:    Attends Archivist Meetings:    Marital Status:     Review of Systems  Constitutional: Negative.   HENT: Negative.   Eyes: Negative.   Respiratory: Negative.   Cardiovascular: Negative.   Gastrointestinal: Negative.   Genitourinary: Negative.   Musculoskeletal: Negative.  Skin: Negative.   Neurological: Negative.   Psychiatric/Behavioral: Negative.      Objective:  BP (!) 130/88    Pulse 71    Temp (!) 97 F (36.1 C)    Resp 18    Ht 5\' 6"  (1.676 m)    Wt (!) 283 lb (128.4 kg)    SpO2 97%    BMI 45.68 kg/m   BP/Weight 09/24/2019 06/24/2019 7/82/4235  Systolic BP 361 443 154  Diastolic BP 88 84 88  Wt. (Lbs) 283 289.8 295.8  BMI 45.68 46.77 46.33    Physical Exam Vitals reviewed.  Constitutional:      Appearance: Normal appearance.  HENT:     Head: Normocephalic.     Right Ear: Tympanic membrane, ear canal and external ear normal.     Left Ear: Tympanic membrane, ear canal and external ear normal.  Eyes:     Extraocular Movements: Extraocular  movements intact.     Conjunctiva/sclera: Conjunctivae normal.     Pupils: Pupils are equal, round, and reactive to light.  Cardiovascular:     Rate and Rhythm: Normal rate and regular rhythm.     Pulses: Normal pulses.     Heart sounds: Normal heart sounds.  Pulmonary:     Effort: Pulmonary effort is normal.     Breath sounds: Normal breath sounds.  Abdominal:     General: Abdomen is flat. Bowel sounds are normal.     Palpations: Abdomen is soft.  Musculoskeletal:        General: Normal range of motion.     Cervical back: Normal range of motion.  Skin:    General: Skin is warm and dry.     Capillary Refill: Capillary refill takes less than 2 seconds.  Neurological:     General: No focal deficit present.     Mental Status: He is alert. Mental status is at baseline.  Psychiatric:        Mood and Affect: Mood normal.        Thought Content: Thought content normal.        Judgment: Judgment normal.       Lab Results  Component Value Date   WBC 8.6 09/24/2019   HGB 14.3 09/24/2019   HCT 43.3 09/24/2019   PLT 297 09/24/2019   GLUCOSE 83 09/24/2019   CHOL 153 04/13/2019   TRIG 150 (H) 04/13/2019   HDL 42 04/13/2019   LDLCALC 85 04/13/2019   ALT 46 (H) 09/24/2019   AST 67 (H) 09/24/2019   NA 142 09/24/2019   K 4.9 09/24/2019   CL 103 09/24/2019   CREATININE 0.85 09/24/2019   BUN 20 09/24/2019   CO2 24 09/24/2019   TSH 4.480 06/24/2019      Assessment & Plan:   1. Other hemochromatosis Patient has hemochromatosis and is getting periodic phlebotomy with Dr. Bobby Rumpf  2. Hypogonadism in male AN INDIVIDUAL CARE PLAN for hypogonadism was established and reinforced today.  The patient's status was assessed using clinical findings on exam, labs, and other diagnostic testing. Patient's success at meeting treatment goals based on disease specific evidence-bassed guidelines and found to be in fair control. RECOMMENDATIONS include maintaining present medicines and  treatment.  3. Morbid obesity ([place]) An individualize plan was formulated for obesity using patient history and physical exam to encourage weight loss.  An evidence based program was formulated.  Patient is to cut portion size with meals and to plan physical exercise 3 days a week at least 20  minutes.  Weight watchers and other programs are helpful.  Planned amount of weight loss 10 lbs.  4. Essential hypertension An individual hypertension care plan was established and reinforced today.  The patient's status was assessed using clinical findings on exam and labs or diagnostic tests. The patient's success at meeting treatment goals on disease specific evidence-based guidelines and found to be well controlled. SELF MANAGEMENT: The patient and I together assessed ways to personally work towards obtaining the recommended goals. RECOMMENDATIONS: avoid decongestants found in common cold remedies, decrease consumption of alcohol, perform routine monitoring of BP with home BP cuff, exercise, reduction of dietary salt, take medicines as prescribed, try not to miss doses and quit smoking.  Regular exercise and maintaining a healthy weight is needed.  Stress reduction may help. A CLINICAL SUMMARY including written plan identify barriers to care unique to individual due to social or financial issues.  We attempt to mutually creat solutions for individual and family understanding.  Diagnoses and all orders for this visit:  -     amLODipine (NORVASC) 5 MG tablet; Take 1 tablet (5 mg total) by mouth daily. -     CBC with Differential/Platelet -     Comprehensive metabolic panel Nonintractable epilepsy without status epilepticus, unspecified epilepsy type ([place]) AN INDIVIDUAL CARE PLAN was established and reinforced today.  The patient's status was assessed using clinical findings on exam, labs, and other diagnostic testing. Patient's success at meeting treatment goals based on disease specific evidence-bassed  guidelines and found to be in good control. RECOMMENDATIONS include maintain present medicines and treatment. OSA (obstructive sleep apnea) AN INDIVIDUAL CARE PLAN for OSA was established and reinforced today.  The patient's status was assessed using clinical findings on exam, labs, and other diagnostic testing. Patient's success at meeting treatment goals based on disease specific evidence-bassed guidelines and found to be in good control. RECOMMENDATIONS include continue regular CPAP use      Follow-up: Return in about 6 months (around 03/26/2020) for fasting.  An After Visit Summary was printed and given to the patient.  [place] [phone]

## 2019-09-24 NOTE — Patient Instructions (Signed)

## 2019-09-25 LAB — CBC WITH DIFFERENTIAL/PLATELET
Basophils Absolute: 0 10*3/uL (ref 0.0–0.2)
Basos: 0 %
EOS (ABSOLUTE): 0.3 10*3/uL (ref 0.0–0.4)
Eos: 3 %
Hematocrit: 43.3 % (ref 37.5–51.0)
Hemoglobin: 14.3 g/dL (ref 13.0–17.7)
Immature Grans (Abs): 0 10*3/uL (ref 0.0–0.1)
Immature Granulocytes: 0 %
Lymphocytes Absolute: 2.2 10*3/uL (ref 0.7–3.1)
Lymphs: 25 %
MCH: 32.1 pg (ref 26.6–33.0)
MCHC: 33 g/dL (ref 31.5–35.7)
MCV: 97 fL (ref 79–97)
Monocytes Absolute: 0.7 10*3/uL (ref 0.1–0.9)
Monocytes: 9 %
Neutrophils Absolute: 5.4 10*3/uL (ref 1.4–7.0)
Neutrophils: 63 %
Platelets: 297 10*3/uL (ref 150–450)
RBC: 4.46 x10E6/uL (ref 4.14–5.80)
RDW: 12.8 % (ref 11.6–15.4)
WBC: 8.6 10*3/uL (ref 3.4–10.8)

## 2019-09-25 LAB — COMPREHENSIVE METABOLIC PANEL
ALT: 46 IU/L — ABNORMAL HIGH (ref 0–44)
AST: 67 IU/L — ABNORMAL HIGH (ref 0–40)
Albumin/Globulin Ratio: 1.9 (ref 1.2–2.2)
Albumin: 4.5 g/dL (ref 4.0–5.0)
Alkaline Phosphatase: 81 IU/L (ref 48–121)
BUN/Creatinine Ratio: 24 — ABNORMAL HIGH (ref 9–20)
BUN: 20 mg/dL (ref 6–24)
Bilirubin Total: 0.4 mg/dL (ref 0.0–1.2)
CO2: 24 mmol/L (ref 20–29)
Calcium: 9.4 mg/dL (ref 8.7–10.2)
Chloride: 103 mmol/L (ref 96–106)
Creatinine, Ser: 0.85 mg/dL (ref 0.76–1.27)
GFR calc Af Amer: 121 mL/min/{1.73_m2} (ref >59)
GFR calc non Af Amer: 104 mL/min/{1.73_m2} (ref >59)
Globulin, Total: 2.4 g/dL (ref 1.5–4.5)
Glucose: 83 mg/dL (ref 65–99)
Potassium: 4.9 mmol/L (ref 3.5–5.2)
Sodium: 142 mmol/L (ref 134–144)
Total Protein: 6.9 g/dL (ref 6.0–8.5)

## 2019-09-25 NOTE — Progress Notes (Signed)
CBC normal, kidney tests normal liver tests slightly elevated lp

## 2019-12-02 ENCOUNTER — Encounter: Payer: Self-pay | Admitting: Pharmacist

## 2019-12-02 HISTORY — DX: Hereditary hemochromatosis: E83.110

## 2019-12-03 LAB — HEPATIC FUNCTION PANEL
ALT: 35 (ref 10–40)
AST: 33 (ref 14–40)
Alkaline Phosphatase: 77 (ref 25–125)
Bilirubin, Total: 0.4

## 2019-12-03 LAB — BASIC METABOLIC PANEL
BUN: 19 (ref 4–21)
CO2: 26 — AB (ref 13–22)
Chloride: 107 (ref 99–108)
Creatinine: 1 (ref 0.6–1.3)
Glucose: 90
Potassium: 3.9 (ref 3.4–5.3)
Sodium: 142 (ref 137–147)

## 2019-12-03 LAB — IRON,TIBC AND FERRITIN PANEL
%SAT: 42.6
Ferritin: 167
Iron: 95
TIBC: 223

## 2019-12-03 LAB — COMPREHENSIVE METABOLIC PANEL
Albumin: 4.2 (ref 3.5–5.0)
Calcium: 9 (ref 8.7–10.7)

## 2019-12-03 LAB — CBC: RBC: 4.64 (ref 3.87–5.11)

## 2019-12-03 LAB — CBC AND DIFFERENTIAL
HCT: 43 (ref 41–53)
Hemoglobin: 14.6 (ref 13.5–17.5)
Platelets: 281 (ref 150–399)
WBC: 11

## 2019-12-09 ENCOUNTER — Other Ambulatory Visit: Payer: Self-pay | Admitting: Hematology and Oncology

## 2019-12-10 ENCOUNTER — Inpatient Hospital Stay: Payer: 59

## 2019-12-10 ENCOUNTER — Inpatient Hospital Stay: Payer: 59 | Attending: Oncology

## 2020-01-07 ENCOUNTER — Institutional Professional Consult (permissible substitution): Payer: 59 | Admitting: Pulmonary Disease

## 2020-01-25 ENCOUNTER — Other Ambulatory Visit: Payer: Self-pay | Admitting: Oncology

## 2020-01-25 NOTE — Progress Notes (Deleted)
[facility]  [address] [place],  [place]  [address] [phone]  Clinic Day:  01/25/2020  Referring physician: Lillard Anes,*   HISTORY OF PRESENT ILLNESS:  The patient is a 44 y.o. 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  The patient has been undergoing phlebotomies with the goal being to get his ferritin below 50.  He comes in today to reassess his iron parameters.  Overall, the patient claims to be doing fairly well.  He denies having any systemic symptoms which concern him for complications related to his underlying hemochromatosis.   PHYSICAL EXAM:  There were no vitals taken for this visit. Wt Readings from Last 3 Encounters:  09/24/19 (!) 283 lb (128.4 kg)  06/24/19 289 lb 12.8 oz (131.5 kg)  06/15/19 295 lb (133.8 kg)   There is no height or weight on file to calculate BMI. Performance status (ECOG): 0 - Asymptomatic Physical Exam Constitutional:      Appearance: Normal appearance. He is not ill-appearing.  HENT:     Mouth/Throat:     Mouth: Mucous membranes are moist.     Pharynx: Oropharynx is clear. No oropharyngeal exudate or posterior oropharyngeal erythema.  Cardiovascular:     Rate and Rhythm: Normal rate and regular rhythm.     Heart sounds: No murmur heard.  No friction rub. No gallop.   Pulmonary:     Effort: Pulmonary effort is normal. No respiratory distress.     Breath sounds: Normal breath sounds. No wheezing, rhonchi or rales.  Abdominal:     General: Bowel sounds are normal. There is no distension.     Palpations: Abdomen is soft. There is no mass.     Tenderness: There is no abdominal tenderness.  Musculoskeletal:        General: No swelling.     Right lower leg: No edema.     Left lower leg: No edema.  Lymphadenopathy:     Cervical: No cervical adenopathy.     Upper Body:     Right upper body: No supraclavicular or axillary adenopathy.     Left upper body: No supraclavicular or axillary  adenopathy.     Lower Body: No right inguinal adenopathy. No left inguinal adenopathy.  Skin:    General: Skin is warm.     Coloration: Skin is not jaundiced.     Findings: No lesion or rash.  Neurological:     General: No focal deficit present.     Mental Status: He is alert and oriented to person, place, and time. Mental status is at baseline.     Cranial Nerves: Cranial nerves are intact.  Psychiatric:        Mood and Affect: Mood normal.        Behavior: Behavior normal.        Thought Content: Thought content normal.     LABS:   CBC Latest Ref Rng & Units 12/03/2019 09/24/2019 06/24/2019  WBC - 11.0 8.6 10.2  Hemoglobin 13.5 - 17.5 14.6 14.3 14.5  Hematocrit 41 - 53 43 43.3 43.2  Platelets 150 - 399 281 297 258   CMP Latest Ref Rng & Units 12/03/2019 09/24/2019 04/13/2019  Glucose 65 - 99 mg/dL - 83 87  BUN 4 - 21 19 20 15   Creatinine 0.6 - 1.3 1.0 0.85 0.90  Sodium 137 - 147 142 142 138  Potassium 3.4 - 5.3 3.9 4.9 4.3  Chloride 99 - 108 107 103 102  CO2 13 -  22 26(A) 24 24  Calcium 8.7 - 10.7 9.0 9.4 9.2  Total Protein 6.0 - 8.5 g/dL - 6.9 7.0  Total Bilirubin 0.0 - 1.2 mg/dL - 0.4 0.2  Alkaline Phos 25 - 125 77 81 94  AST 14 - 40 33 67(H) 27  ALT 10 - 40 35 46(H) 36     No results found for: CEA1 / No results found for: CEA1 No results found for: PSA1 No results found for: LDK446 No results found for: FJU122  No results found for: TOTALPROTELP, ALBUMINELP, A1GS, A2GS, BETS, BETA2SER, GAMS, MSPIKE, SPEI Lab Results  Component Value Date   TIBC 223 12/03/2019   FERRITIN 167 12/03/2019   FERRITIN 552 (H) 04/13/2019   IRONPCTSAT 42.6 12/03/2019   No results found for: LDH  No results found for: AFPTUMOR, TOTALPROTELP, ALBUMINELP, A1GS, A2GS, BETS, BETA2SER, GAMS, MSPIKE, SPEI, LDH, CEA1, PSA1, IGASERUM, IGGSERUM, IGMSERUM, THGAB, THYROGLB   STUDIES:  No results found.    ASSESSMENT & PLAN:   Assessment/Plan:  A 44 y.o. male with *** .The patient  understands all the plans discussed today and is in agreement with them.      Andric Kerce Macarthur Critchley, MD

## 2020-01-26 ENCOUNTER — Inpatient Hospital Stay: Payer: No Typology Code available for payment source | Admitting: Oncology

## 2020-01-26 ENCOUNTER — Inpatient Hospital Stay: Payer: No Typology Code available for payment source | Attending: Oncology

## 2020-02-09 ENCOUNTER — Institutional Professional Consult (permissible substitution): Payer: 59 | Admitting: Pulmonary Disease

## 2020-03-04 DIAGNOSIS — U071 COVID-19: Secondary | ICD-10-CM | POA: Insufficient documentation

## 2020-03-04 HISTORY — DX: COVID-19: U07.1

## 2020-03-27 ENCOUNTER — Ambulatory Visit: Payer: Managed Care, Other (non HMO) | Admitting: Legal Medicine

## 2020-03-27 DIAGNOSIS — I10 Essential (primary) hypertension: Secondary | ICD-10-CM

## 2020-03-27 DIAGNOSIS — G4733 Obstructive sleep apnea (adult) (pediatric): Secondary | ICD-10-CM

## 2020-03-27 DIAGNOSIS — E291 Testicular hypofunction: Secondary | ICD-10-CM

## 2020-03-27 DIAGNOSIS — G40909 Epilepsy, unspecified, not intractable, without status epilepticus: Secondary | ICD-10-CM

## 2020-04-10 ENCOUNTER — Other Ambulatory Visit: Payer: Self-pay

## 2020-04-10 ENCOUNTER — Ambulatory Visit: Payer: No Typology Code available for payment source | Admitting: Legal Medicine

## 2020-04-10 ENCOUNTER — Encounter: Payer: Self-pay | Admitting: Legal Medicine

## 2020-04-10 DIAGNOSIS — M2392 Unspecified internal derangement of left knee: Secondary | ICD-10-CM | POA: Insufficient documentation

## 2020-04-10 DIAGNOSIS — B029 Zoster without complications: Secondary | ICD-10-CM

## 2020-04-10 HISTORY — DX: Zoster without complications: B02.9

## 2020-04-10 HISTORY — DX: Unspecified internal derangement of left knee: M23.92

## 2020-04-10 MED ORDER — VALACYCLOVIR HCL 1 G PO TABS: 1000.0000 mg | ORAL_TABLET | Freq: Two times a day (BID) | ORAL | 0 refills | Status: DC

## 2020-04-10 MED ORDER — PREDNISONE 10 MG (21) PO TBPK: ORAL_TABLET | ORAL | 0 refills | Status: DC

## 2020-04-10 MED ORDER — HYDROCODONE-ACETAMINOPHEN 10-325 MG PO TABS: 1.0000 | ORAL_TABLET | Freq: Three times a day (TID) | ORAL | 0 refills | Status: AC | PRN

## 2020-04-10 NOTE — Progress Notes (Signed)
Subjective:  Patient ID: Austin Gilbert, male    DOB: Jun 06, 1975  Age: 45 y.o. MRN: [id number]  Chief Complaint  Patient presents with  . Rash    Since one week ago and it is burning on right side of the chest    HPI: rash on chest wall.  Present for 1 week.  It is burning and itching. , he has port for hemachromatosis. Pain sleeping and lying back.Patient has checken pox as child.   Current Outpatient Medications on File Prior to Visit  Medication Sig Dispense Refill  . Multiple Vitamin (MULTIVITAMIN) tablet Take 1 tablet by mouth daily.    . Zinc 100 MG TABS Take by mouth.    Marland Kitchen amLODipine (NORVASC) 5 MG tablet Take 1 tablet (5 mg total) by mouth daily. 90 tablet 2   No current facility-administered medications on file prior to visit.   Past Medical History:  Diagnosis Date  . Epilepsy (HCC)   . Hemochromatosis   . Other hemochromatosis    History reviewed. No pertinent surgical history.  Family History  Problem Relation Age of Onset  . Heart disease Mother   . Hypertension Mother   . Hyperlipidemia Mother   . Transient ischemic attack Mother   . COPD Father   . Testicular cancer Brother    Social History   Socioeconomic History  . Marital status: Married    Spouse name: Not on file  . Number of children: Not on file  . Years of education: Not on file  . Highest education level: Not on file  Occupational History    Employer: [employer]  Tobacco Use  . Smoking status: Never Smoker  . Smokeless tobacco: Never Used  Vaping Use  . Vaping Use: Never used  Substance and Sexual Activity  . Alcohol use: Not Currently  . Drug use: Not Currently    Types: Marijuana    Comment: Experimented with  . Sexual activity: Yes    Partners: Female  Other Topics Concern  . Not on file  Social History Narrative  . Not on file   Social Determinants of Health   Financial Resource Strain: Not on file  Food Insecurity: Not on file  Transportation Needs: Not on  file  Physical Activity: Not on file  Stress: Not on file  Social Connections: Not on file    Review of Systems  Constitutional: Negative.  Negative for activity change and unexpected weight change.  HENT: Negative.   Respiratory: Negative.  Negative for chest tightness and shortness of breath.   Cardiovascular: Negative.  Negative for chest pain.  Gastrointestinal: Negative for abdominal distention and abdominal pain.  Endocrine: Negative for polyuria.  Genitourinary: Negative for difficulty urinating and dysuria.  Musculoskeletal: Negative.  Negative for arthralgias and back pain.  Skin: Positive for rash.  Neurological: Negative.   Psychiatric/Behavioral: Negative.      Objective:  BP 132/86   Pulse 100   Temp 97.6 F (36.4 C)   Resp 17   Ht 5\' 7"  (1.702 m)   Wt 280 lb (127 kg)   SpO2 97%   BMI 43.85 kg/m   BP/Weight 04/10/2020 09/24/2019 06/24/2019  Systolic BP 132 130 130  Diastolic BP 86 88 84  Wt. (Lbs) 280 283 289.8  BMI 43.85 45.68 46.77    Physical Exam Vitals reviewed.  Constitutional:      Appearance: Normal appearance.  HENT:     Head: Normocephalic and atraumatic.     Right Ear:  Tympanic membrane, ear canal and external ear normal.     Left Ear: Tympanic membrane, ear canal and external ear normal.     Mouth/Throat:     Mouth: Mucous membranes are moist.  Eyes:     Extraocular Movements: Extraocular movements intact.     Conjunctiva/sclera: Conjunctivae normal.     Pupils: Pupils are equal, round, and reactive to light.  Cardiovascular:     Rate and Rhythm: Normal rate and regular rhythm.     Pulses: Normal pulses.     Heart sounds: Normal heart sounds.  Pulmonary:     Effort: Pulmonary effort is normal.     Breath sounds: Normal breath sounds.  Abdominal:     General: Abdomen is flat. Bowel sounds are normal. There is no distension.     Palpations: Abdomen is soft.     Tenderness: There is no abdominal tenderness.  Musculoskeletal:      Cervical back: Normal range of motion and neck supple.     Left knee: Swelling and effusion present. Decreased range of motion. No LCL laxity, MCL laxity, ACL laxity or PCL laxity.    Instability Tests: Anterior drawer test negative. Posterior drawer test negative. Anterior Lachman test negative. Medial McMurray test positive.     Comments: Pain and effusion left knee.  Pain over medial joint line.  Skin:    General: Skin is warm.     Capillary Refill: Capillary refill takes less than 2 seconds.     Findings: Erythema present.     Comments: Grouped vesicles on erythematous base, about T7.  Neurological:     General: No focal deficit present.     Mental Status: He is alert. Mental status is at baseline.  Psychiatric:        Mood and Affect: Mood normal.        Thought Content: Thought content normal.        Judgment: Judgment normal.       Lab Results  Component Value Date   WBC 11.0 12/03/2019   HGB 14.6 12/03/2019   HCT 43 12/03/2019   PLT 281 12/03/2019   GLUCOSE 83 09/24/2019   CHOL 153 04/13/2019   TRIG 150 (H) 04/13/2019   HDL 42 04/13/2019   LDLCALC 85 04/13/2019   ALT 35 12/03/2019   AST 33 12/03/2019   NA 142 12/03/2019   K 3.9 12/03/2019   CL 107 12/03/2019   CREATININE 1.0 12/03/2019   BUN 19 12/03/2019   CO2 26 (A) 12/03/2019   TSH 4.480 06/24/2019      Assessment & Plan:   Diagnoses and all orders for this visit: Herpes zoster without complication -     predniSONE (STERAPRED UNI-PAK 21 TAB) 10 MG (21) TBPK tablet; Take 6ills first day , then 5 pills day 2 and then cut down one pill day until gone -     valACYclovir (VALTREX) 1000 MG tablet; Take 1 tablet (1,000 mg total) by mouth 2 (two) times daily. Patient has acute shingles, we discussed keeping it covered.  Hydrocodone given #15 Uric acid drawn Acute internal derangement of left knee -     DG Knee Complete 4 Views Left Patient has left knee injury weeks ago, he need x-ray, I will follow up one  week         I spent 25 minutes dedicated to the care of this patient on the date of this encounter to include face-to-face time with the patient, as well as:  Follow-up: Return in about 1 week (around 04/17/2020) for shingles.  An After Visit Summary was printed and given to the patient.  Reinaldo Meeker, MD [facility] [phone]

## 2020-04-10 NOTE — Patient Instructions (Signed)
Shingles  Shingles is an infection. It gives you a painful skin rash and blisters that have fluid in them. Shingles is caused by the same germ (virus) that causes chickenpox. Shingles only happens in people who:  Have had chickenpox.  Have been given a shot of medicine (vaccine) to protect against chickenpox. Shingles is rare in this group. The first symptoms of shingles may be itching, tingling, or pain in an area on your skin. A rash will show on your skin a few days or weeks later. The rash is likely to be on one side of your body. The rash usually has a shape like a belt or a band. Over time, the rash turns into fluid-filled blisters. The blisters will break open, change into scabs, and dry up. Medicines may:  Help with pain and itching.  Help you get better sooner.  Help to prevent long-term problems. Follow these instructions at home: Medicines  Take over-the-counter and prescription medicines only as told by your doctor.  Put on an anti-itch cream or numbing cream where you have a rash, blisters, or scabs. Do this as told by your doctor. Helping with itching and discomfort  Put cold, wet cloths (cold compresses) on the area of the rash or blisters as told by your doctor.  Cool baths can help you feel better. Try adding baking soda or dry oatmeal to the water to lessen itching. Do not bathe in hot water.   Blister and rash care  Keep your rash covered with a loose bandage (dressing).  Wear loose clothing that does not rub on your rash.  Keep your rash and blisters clean. To do this, wash the area with mild soap and cool water as told by your doctor.  Check your rash every day for signs of infection. Check for: ? More redness, swelling, or pain. ? Fluid or blood. ? Warmth. ? Pus or a bad smell.  Do not scratch your rash. Do not pick at your blisters. To help you to not scratch: ? Keep your fingernails clean and cut short. ? Wear gloves or mittens when you sleep, if  scratching is a problem. General instructions  Rest as told by your doctor.  Keep all follow-up visits as told by your doctor. This is important.  Wash your hands often with soap and water. If soap and water are not available, use hand sanitizer. Doing this lowers your chance of getting a skin infection caused by germs (bacteria).  Your infection can cause chickenpox in people who have never had chickenpox or never got a shot of chickenpox vaccine. If you have blisters that did not change into scabs yet, try not to touch other people or be around other people, especially: ? Babies. ? Pregnant women. ? Children who have areas of red, itchy, or rough skin (eczema). ? Very old people who have transplants. ? People who have a long-term (chronic) sickness, like cancer or AIDS. Contact a doctor if:  Your pain does not get better with medicine.  Your pain does not get better after the rash heals.  You have any signs of infection in the rash area. These signs include: ? More redness, swelling, or pain around the rash. ? Fluid or blood coming from the rash. ? The rash area feeling warm to the touch. ? Pus or a bad smell coming from the rash. Get help right away if:  The rash is on your face or nose.  You have pain in your face or pain  by your eye.  You lose feeling on one side of your face.  You have trouble seeing.  You have ear pain, or you have ringing in your ear.  You have a loss of taste.  Your condition gets worse. Summary  Shingles gives you a painful skin rash and blisters that have fluid in them.  Shingles is an infection. It is caused by the same germ (virus) that causes chickenpox.  Keep your rash covered with a loose bandage (dressing). Wear loose clothing that does not rub on your rash.  If you have blisters that did not change into scabs yet, try not to touch other people or be around people. This information is not intended to replace advice given to you by  your health care provider. Make sure you discuss any questions you have with your health care provider. Document Revised: 06/12/2018 Document Reviewed: 10/23/2016 Elsevier Patient Education  2021 [employer].

## 2020-04-17 ENCOUNTER — Ambulatory Visit: Payer: No Typology Code available for payment source | Admitting: Legal Medicine

## 2020-04-18 ENCOUNTER — Other Ambulatory Visit: Payer: Self-pay

## 2020-04-18 DIAGNOSIS — D1622 Benign neoplasm of long bones of left lower limb: Secondary | ICD-10-CM

## 2020-04-21 ENCOUNTER — Telehealth: Payer: Self-pay | Admitting: Cardiology

## 2020-04-21 NOTE — Telephone Encounter (Signed)
New message:    Patient wife calling stating that patient had covid four weeks ago patient feels like his port patient feels like he's being poked.

## 2020-04-21 NOTE — Telephone Encounter (Signed)
Pt is 4 week post COVID and has been having chest pain. Pt's wife states that this am he was woke up form his sleep with chest tightness that resolved. Appt made for pt but advised that if the pain returns and it he has additional sx he should call 911 or go to the nearest ED. Pt's wife verbalized understanding and had no additional questions.

## 2020-04-24 ENCOUNTER — Other Ambulatory Visit: Payer: Self-pay

## 2020-04-24 ENCOUNTER — Encounter: Payer: Self-pay | Admitting: Cardiology

## 2020-04-24 ENCOUNTER — Ambulatory Visit (INDEPENDENT_AMBULATORY_CARE_PROVIDER_SITE_OTHER): Payer: No Typology Code available for payment source | Admitting: Cardiology

## 2020-04-24 VITALS — BP 136/74 | HR 93 | Ht 66.0 in | Wt 283.0 lb

## 2020-04-24 DIAGNOSIS — G4733 Obstructive sleep apnea (adult) (pediatric): Secondary | ICD-10-CM

## 2020-04-24 DIAGNOSIS — U071 COVID-19: Secondary | ICD-10-CM | POA: Diagnosis not present

## 2020-04-24 DIAGNOSIS — I10 Essential (primary) hypertension: Secondary | ICD-10-CM

## 2020-04-24 DIAGNOSIS — R072 Precordial pain: Secondary | ICD-10-CM

## 2020-04-24 DIAGNOSIS — G40909 Epilepsy, unspecified, not intractable, without status epilepticus: Secondary | ICD-10-CM | POA: Insufficient documentation

## 2020-04-24 NOTE — Progress Notes (Signed)
Cardiology Office Note:    Date:  04/24/2020   ID:  Austin Gilbert, DOB [birth year], MRN [id number]  PCP:  Lillard Anes, MD  Cardiologist:  Jenne Campus, MD    Referring MD: Lillard Anes,*   Chief Complaint  Patient presents with  . Chest Pain    History of Present Illness:    Austin Gilbert is a 45 y.o. male with past medical history significant for essential hypertension, morbid obesity, obstructive sleep apnea, hereditary hemachromatosis.  He requested to be seen because of episode of chest pain.  About a month ago he got COVID-19 infection he was not too sick but he did have a lot of cough.  Since that time he is complaining of having poking-like sensation of the right side of his chest that is worse with touching and pressing.  Really does not look cardiac at all.  There is no change in the skin in this area.  He also however described 1 episode when he woke up in the middle of the night with tightness in the chest that sensation lasted for about 20 to 30 minutes and then it went away.  He did have evaluation for coronary artery disease done in April 2021 in form of stress test which was negative.  Past Medical History:  Diagnosis Date  . COVID-19 03/2020  . Epilepsy ([place])   . Hemochromatosis   . Other hemochromatosis     History reviewed. No pertinent surgical history.  Current Medications: Current Meds  Medication Sig  . amLODipine (NORVASC) 5 MG tablet Take 1 tablet (5 mg total) by mouth daily.  . Multiple Vitamin (MULTIVITAMIN) tablet Take 1 tablet by mouth daily.  . Zinc 100 MG TABS Take by mouth.     Allergies:   Patient has no known allergies.   Social History   Socioeconomic History  . Marital status: Married    Spouse name: Not on file  . Number of children: Not on file  . Years of education: Not on file  . Highest education level: Not on file  Occupational History    Employer: [employer]  Tobacco Use  . Smoking  status: Never Smoker  . Smokeless tobacco: Never Used  Vaping Use  . Vaping Use: Never used  Substance and Sexual Activity  . Alcohol use: Not Currently  . Drug use: Not Currently    Types: Marijuana    Comment: Experimented with  . Sexual activity: Yes    Partners: Female  Other Topics Concern  . Not on file  Social History Narrative  . Not on file   Social Determinants of Health   Financial Resource Strain: Not on file  Food Insecurity: Not on file  Transportation Needs: Not on file  Physical Activity: Not on file  Stress: Not on file  Social Connections: Not on file     Family History: The patient's family history includes COPD in his father; Heart disease in his mother; Hyperlipidemia in his mother; Hypertension in his mother; Testicular cancer in his brother; Transient ischemic attack in his mother. ROS:   Please see the history of present illness.    All 14 point review of systems negative except as described per history of present illness  EKGs/Labs/Other Studies Reviewed:      Recent Labs: 06/24/2019: TSH 4.480 12/03/2019: ALT 35; BUN 19; Creatinine 1.0; Hemoglobin 14.6; Platelets 281; Potassium 3.9; Sodium 142  Recent Lipid Panel    Component Value Date/Time   CHOL  153 04/13/2019 1600   TRIG 150 (H) 04/13/2019 1600   HDL 42 04/13/2019 1600   CHOLHDL 3.6 04/13/2019 1600   LDLCALC 85 04/13/2019 1600    Physical Exam:    VS:  BP 136/74 (BP Location: Left Arm, Patient Position: Sitting)   Pulse 93   Ht 5\' 6"  (1.676 m)   Wt 283 lb (128.4 kg)   SpO2 97%   BMI 45.68 kg/m     Wt Readings from Last 3 Encounters:  04/24/20 283 lb (128.4 kg)  04/10/20 280 lb (127 kg)  09/24/19 (!) 283 lb (128.4 kg)     GEN:  Well nourished, well developed in no acute distress HEENT: Normal NECK: No JVD; No carotid bruits LYMPHATICS: No lymphadenopathy CARDIAC: RRR, no murmurs, no rubs, no gallops RESPIRATORY:  Clear to auscultation without rales, wheezing or rhonchi   ABDOMEN: Soft, non-tender, non-distended MUSCULOSKELETAL:  No edema; No deformity  SKIN: Warm and dry LOWER EXTREMITIES: no swelling NEUROLOGIC:  Alert and oriented x 3 PSYCHIATRIC:  Normal affect   ASSESSMENT:    1. Precordial pain   2. Hemochromatosis, unspecified hemochromatosis type   3. COVID-19   4. Hereditary hemochromatosis ([place])   5. Essential hypertension   6. OSA (obstructive sleep apnea)    PLAN:    In order of problems listed above:  1. Chest pain which is very atypical.  I am not worried about poking sensation more concerned about tightness that he developed in the middle of the night.  I think it would be appropriate to stratify his risk for coronary artery disease with calcium score.  We will schedule him to have a calcium score.  I will not change any of his medications. 2. Hemochromatosis to be followed by oncology team. 3. Essential hypertension blood pressure seems to be well controlled continue present management. 4. Dyslipidemia I did review his K PN which show me his LDL of 85 HDL of 42 this is from 04/13/2019.  We will continue present management.  He is taking no statins now. 5. Obstructive sleep apnea noted.  He is using CPAP on the regular basis.  I did talk to his wife over the phone during the visit.   Medication Adjustments/Labs and Tests Ordered: Current medicines are reviewed at length with the patient today.  Concerns regarding medicines are outlined above.  No orders of the defined types were placed in this encounter.  Medication changes: No orders of the defined types were placed in this encounter.   Signed, Park Liter, MD, [facility] 04/24/2020 4:41 PM    [place]

## 2020-04-24 NOTE — Addendum Note (Signed)
Addended by: Senaida Ores on: 04/24/2020 04:55 PM   Modules accepted: Orders

## 2020-04-24 NOTE — Patient Instructions (Signed)
Medication Instructions:  Your physician recommends that you continue on your current medications as directed. Please refer to the Current Medication list given to you today.  *If you need a refill on your cardiac medications before your next appointment, please call your pharmacy*   Lab Work: Your physician recommends that you return for lab work today: cbc, bmp, ferritin, tbic  If you have labs (blood work) drawn today and your tests are completely normal, you will receive your results only by: Marland Kitchen MyChart Message (if you have MyChart) OR . A paper copy in the mail If you have any lab test that is abnormal or we need to change your treatment, we will call you to review the results.   Testing/Procedures: Your physician has requested that you have an echocardiogram. Echocardiography is a painless test that uses sound waves to create images of your heart. It provides your doctor with information about the size and shape of your heart and how well your heart's chambers and valves are working. This procedure takes approximately one hour. There are no restrictions for this procedure.  Non-Cardiac CT scanning, (CAT scanning), is a noninvasive, special x-ray that produces cross-sectional images of the body using x-rays and a computer. CT scans help physicians diagnose and treat medical conditions. For some CT exams, a contrast material is used to enhance visibility in the area of the body being studied. CT scans provide greater clarity and reveal more details than regular x-ray exams.     Follow-Up: At [facility], you and your health needs are our priority.  As part of our continuing mission to provide you with exceptional heart care, we have created designated Provider Care Teams.  These Care Teams include your primary Cardiologist (physician) and Advanced Practice Providers (APPs -  Physician Assistants and Nurse Practitioners) who all work together to provide you with the care you need, when  you need it.  We recommend signing up for the patient portal called "MyChart".  Sign up information is provided on this After Visit Summary.  MyChart is used to connect with patients for Virtual Visits (Telemedicine).  Patients are able to view lab/test results, encounter notes, upcoming appointments, etc.  Non-urgent messages can be sent to your provider as well.   To learn more about what you can do with MyChart, go to [email or web address].    Your next appointment:   6 month(s)  The format for your next appointment:   In Person  Provider:   Jenne Campus, MD   Other Instructions

## 2020-04-25 LAB — CBC
Hematocrit: 45 % (ref 37.5–51.0)
Hemoglobin: 15.2 g/dL (ref 13.0–17.7)
MCH: 31.7 pg (ref 26.6–33.0)
MCHC: 33.8 g/dL (ref 31.5–35.7)
MCV: 94 fL (ref 79–97)
Platelets: 251 10*3/uL (ref 150–450)
RBC: 4.8 x10E6/uL (ref 4.14–5.80)
RDW: 13.1 % (ref 11.6–15.4)
WBC: 14.2 10*3/uL — ABNORMAL HIGH (ref 3.4–10.8)

## 2020-04-25 LAB — BASIC METABOLIC PANEL
BUN/Creatinine Ratio: 15 (ref 9–20)
BUN: 19 mg/dL (ref 6–24)
CO2: 22 mmol/L (ref 20–29)
Calcium: 9.3 mg/dL (ref 8.7–10.2)
Chloride: 101 mmol/L (ref 96–106)
Creatinine, Ser: 1.28 mg/dL — ABNORMAL HIGH (ref 0.76–1.27)
GFR calc Af Amer: 77 mL/min/{1.73_m2} (ref >59)
GFR calc non Af Amer: 67 mL/min/{1.73_m2} (ref >59)
Glucose: 84 mg/dL (ref 65–99)
Potassium: 4.2 mmol/L (ref 3.5–5.2)
Sodium: 140 mmol/L (ref 134–144)

## 2020-04-25 LAB — FERRITIN: Ferritin: 394 ng/mL (ref 30–400)

## 2020-04-25 LAB — IRON AND TIBC
Iron Saturation: 33 % (ref 15–55)
Iron: 76 ug/dL (ref 38–169)
Total Iron Binding Capacity: 229 ug/dL — ABNORMAL LOW (ref 250–450)
UIBC: 153 ug/dL (ref 111–343)

## 2020-04-28 ENCOUNTER — Other Ambulatory Visit: Payer: Self-pay

## 2020-04-28 DIAGNOSIS — D1622 Benign neoplasm of long bones of left lower limb: Secondary | ICD-10-CM

## 2020-05-08 ENCOUNTER — Ambulatory Visit (INDEPENDENT_AMBULATORY_CARE_PROVIDER_SITE_OTHER)
Admission: RE | Admit: 2020-05-08 | Discharge: 2020-05-08 | Disposition: A | Discharge status: Home / Self Care | Payer: Self-pay | Source: Ambulatory Visit | Attending: Cardiology | Admitting: Cardiology

## 2020-05-08 ENCOUNTER — Other Ambulatory Visit: Payer: Self-pay

## 2020-05-08 DIAGNOSIS — I10 Essential (primary) hypertension: Secondary | ICD-10-CM

## 2020-05-08 DIAGNOSIS — G4733 Obstructive sleep apnea (adult) (pediatric): Secondary | ICD-10-CM

## 2020-05-08 DIAGNOSIS — U071 COVID-19: Secondary | ICD-10-CM

## 2020-05-08 DIAGNOSIS — R072 Precordial pain: Secondary | ICD-10-CM

## 2020-05-24 ENCOUNTER — Other Ambulatory Visit: Payer: No Typology Code available for payment source

## 2020-06-27 ENCOUNTER — Other Ambulatory Visit: Payer: Self-pay

## 2020-06-27 ENCOUNTER — Encounter: Payer: Self-pay | Admitting: Legal Medicine

## 2020-06-27 ENCOUNTER — Ambulatory Visit: Payer: No Typology Code available for payment source | Admitting: Legal Medicine

## 2020-06-27 VITALS — BP 122/80 | Temp 97.6°F | Resp 16 | Ht 66.0 in | Wt 275.0 lb

## 2020-06-27 DIAGNOSIS — G40909 Epilepsy, unspecified, not intractable, without status epilepticus: Secondary | ICD-10-CM

## 2020-06-27 DIAGNOSIS — D1622 Benign neoplasm of long bones of left lower limb: Secondary | ICD-10-CM

## 2020-06-27 DIAGNOSIS — K529 Noninfective gastroenteritis and colitis, unspecified: Secondary | ICD-10-CM

## 2020-06-27 HISTORY — DX: Benign neoplasm of long bones of left lower limb: D16.22

## 2020-06-27 MED ORDER — DIPHENOXYLATE-ATROPINE 2.5-0.025 MG PO TABS: 1.0000 | ORAL_TABLET | Freq: Four times a day (QID) | ORAL | 0 refills | Status: DC | PRN

## 2020-06-27 NOTE — Progress Notes (Signed)
Subjective:  Patient ID: Austin Gilbert, male    DOB: [birth year]  Age: 46 y.o. MRN: [id number]  Chief Complaint  Patient presents with  . Emesis    For 2 days  . Diarrhea    For 2 days    HPI: gastroenteritis caught from nephew over weekend.  He started with diarrhea 3 times a day.  No fever or chills, one episode of vomiting.  Eating toast oly.   Current Outpatient Medications on File Prior to Visit  Medication Sig Dispense Refill  . Multiple Vitamin (MULTIVITAMIN) tablet Take 1 tablet by mouth daily.    . Zinc 100 MG TABS Take by mouth.    Marland Kitchen amLODipine (NORVASC) 5 MG tablet Take 1 tablet (5 mg total) by mouth daily. 90 tablet 2  . meloxicam (MOBIC) 15 MG tablet Take 1 tablet by mouth daily. (Patient not taking: Reported on 06/27/2020)     No current facility-administered medications on file prior to visit.   Past Medical History:  Diagnosis Date  . COVID-19 03/2020  . Epilepsy ([place])   . Hemochromatosis   . Other hemochromatosis    History reviewed. No pertinent surgical history.  Family History  Problem Relation Age of Onset  . Heart disease Mother   . Hypertension Mother   . Hyperlipidemia Mother   . Transient ischemic attack Mother   . COPD Father   . Testicular cancer Brother    Social History   Socioeconomic History  . Marital status: Married    Spouse name: Not on file  . Number of children: Not on file  . Years of education: Not on file  . Highest education level: Not on file  Occupational History    Employer: [employer]  Tobacco Use  . Smoking status: Never Smoker  . Smokeless tobacco: Never Used  Vaping Use  . Vaping Use: Never used  Substance and Sexual Activity  . Alcohol use: Not Currently  . Drug use: Not Currently    Types: Marijuana    Comment: Experimented with  . Sexual activity: Yes    Partners: Female  Other Topics Concern  . Not on file  Social History Narrative  . Not on file   Social Determinants of Health    Financial Resource Strain: Not on file  Food Insecurity: Not on file  Transportation Needs: Not on file  Physical Activity: Not on file  Stress: Not on file  Social Connections: Not on file    Review of Systems  Constitutional: Negative for activity change and appetite change.  Respiratory: Negative for choking, chest tightness and shortness of breath.   Cardiovascular: Negative for chest pain and leg swelling.  Gastrointestinal: Positive for abdominal pain and diarrhea.  Musculoskeletal: Negative.   Skin: Negative.   Hematological: Negative.   Psychiatric/Behavioral: Negative.      Objective:  BP 122/80   Temp 97.6 F (36.4 C)   Resp 16   Ht 5\' 6"  (1.676 m)   Wt 275 lb (124.7 kg)   BMI 44.39 kg/m   BP/Weight 06/27/2020 6/62/9476 07/05/6501  Systolic BP 546 568 127  Diastolic BP 80 74 86  Wt. (Lbs) 275 283 280  BMI 44.39 45.68 43.85    Physical Exam Vitals reviewed.  Constitutional:      Appearance: Normal appearance.  HENT:     Right Ear: Tympanic membrane normal.     Left Ear: Tympanic membrane normal.     Mouth/Throat:     Pharynx: Oropharynx is  clear.  Eyes:     Conjunctiva/sclera: Conjunctivae normal.     Pupils: Pupils are equal, round, and reactive to light.  Cardiovascular:     Rate and Rhythm: Normal rate and regular rhythm.     Pulses: Normal pulses.     Heart sounds: Normal heart sounds. No murmur heard. No gallop.   Pulmonary:     Effort: Pulmonary effort is normal. No respiratory distress.     Breath sounds: Normal breath sounds. No rales.  Abdominal:     General: Abdomen is flat. Bowel sounds are normal. There is no distension.     Palpations: Abdomen is soft.     Tenderness: There is no abdominal tenderness.  Musculoskeletal:        General: Normal range of motion.     Cervical back: Normal range of motion and neck supple.  Neurological:     General: No focal deficit present.     Mental Status: He is alert.       Lab Results   Component Value Date   WBC 14.2 (H) 04/24/2020   HGB 15.2 04/24/2020   HCT 45.0 04/24/2020   PLT 251 04/24/2020   GLUCOSE 84 04/24/2020   CHOL 153 04/13/2019   TRIG 150 (H) 04/13/2019   HDL 42 04/13/2019   LDLCALC 85 04/13/2019   ALT 35 12/03/2019   AST 33 12/03/2019   NA 140 04/24/2020   K 4.2 04/24/2020   CL 101 04/24/2020   CREATININE 1.28 (H) 04/24/2020   BUN 19 04/24/2020   CO2 22 04/24/2020   TSH 4.480 06/24/2019      Assessment & Plan:   Diagnoses and all orders for this visit: Gastroenteritis -     diphenoxylate-atropine (LOMOTIL) 2.5-0.025 MG tablet; Take 1 tablet by mouth 4 (four) times daily as needed for diarrhea or loose stools. Patient has viral gastroenteritis and will be treated symptomatically  Morbid obesity ([place]) An individualize plan was formulated for obesity using patient history and physical exam to encourage weight loss.  An evidence based program was formulated.  Patient is to cut portion size with meals and to plan physical exercise 3 days a week at least 20 minutes.  Weight watchers and other programs are helpful.  Planned amount of weight loss 10 lbs.  Nonintractable epilepsy without status epilepticus, unspecified epilepsy type ([place]) patien tha snot had seizures in years  Osteochondroma of left femur Patient will watch his osteochondroma of leg, surgery if it continues to grow         Follow-up: Return if symptoms worsen or fail to improve.  An After Visit Summary was printed and given to the patient.  Reinaldo Meeker, MD [facility] [phone]

## 2020-07-25 ENCOUNTER — Other Ambulatory Visit: Payer: Self-pay

## 2020-07-25 ENCOUNTER — Ambulatory Visit (INDEPENDENT_AMBULATORY_CARE_PROVIDER_SITE_OTHER): Payer: No Typology Code available for payment source | Admitting: Legal Medicine

## 2020-07-25 ENCOUNTER — Encounter: Payer: Self-pay | Admitting: Legal Medicine

## 2020-07-25 VITALS — BP 120/80 | HR 72 | Temp 98.6°F | Wt 275.0 lb

## 2020-07-25 DIAGNOSIS — Z6841 Body Mass Index (BMI) 40.0 and over, adult: Secondary | ICD-10-CM | POA: Diagnosis not present

## 2020-07-25 DIAGNOSIS — I10 Essential (primary) hypertension: Secondary | ICD-10-CM | POA: Diagnosis not present

## 2020-07-25 DIAGNOSIS — E291 Testicular hypofunction: Secondary | ICD-10-CM

## 2020-07-25 DIAGNOSIS — N529 Male erectile dysfunction, unspecified: Secondary | ICD-10-CM

## 2020-07-25 DIAGNOSIS — E782 Mixed hyperlipidemia: Secondary | ICD-10-CM

## 2020-07-25 MED ORDER — SILDENAFIL CITRATE 100 MG PO TABS: 100.0000 mg | ORAL_TABLET | Freq: Every day | ORAL | 3 refills | Status: DC | PRN

## 2020-07-25 NOTE — Progress Notes (Signed)
Subjective:  Patient ID: Austin Gilbert, male    DOB: [birth year]  Age: 45 y.o. MRN: [id number]  Chief Complaint  Patient presents with  . Fatigue  . Hypertension    HPI: chronic visit  Patient having ED for 5 years.  He works a lot. Gets erection at night and can have sex early. He low testosterone. He was formerly on testosterone pills in [place]  Patient presents for follow up of hypertension.  Patient tolerating amlodipine well with side effects.  Patient was diagnosed with hypertension 2010 so has been treated for hypertension for 10 years.Patient is working on maintaining diet and exercise regimen and follows up as directed. Complication include none.   Current Outpatient Medications on File Prior to Visit  Medication Sig Dispense Refill  . cholecalciferol (VITAMIN D3) 25 MCG (1000 UNIT) tablet Take 1,000 Units by mouth daily.    Marland Kitchen amLODipine (NORVASC) 5 MG tablet Take 1 tablet (5 mg total) by mouth daily. 90 tablet 2  . meloxicam (MOBIC) 15 MG tablet Take 1 tablet by mouth daily. (Patient not taking: Reported on 06/27/2020)    . Multiple Vitamin (MULTIVITAMIN) tablet Take 1 tablet by mouth daily.    . Zinc 100 MG TABS Take by mouth.     No current facility-administered medications on file prior to visit.   Past Medical History:  Diagnosis Date  . COVID-19 03/2020  . Epilepsy ([place])   . Hemochromatosis   . Other hemochromatosis    History reviewed. No pertinent surgical history.  Family History  Problem Relation Age of Onset  . Heart disease Mother   . Hypertension Mother   . Hyperlipidemia Mother   . Transient ischemic attack Mother   . COPD Father   . Testicular cancer Brother    Social History   Socioeconomic History  . Marital status: Married    Spouse name: Not on file  . Number of children: Not on file  . Years of education: Not on file  . Highest education level: Not on file  Occupational History    Employer: [employer]  Tobacco Use  .  Smoking status: Never Smoker  . Smokeless tobacco: Never Used  Vaping Use  . Vaping Use: Never used  Substance and Sexual Activity  . Alcohol use: Not Currently  . Drug use: Not Currently    Types: Marijuana    Comment: Experimented with  . Sexual activity: Yes    Partners: Female  Other Topics Concern  . Not on file  Social History Narrative  . Not on file   Social Determinants of Health   Financial Resource Strain: Not on file  Food Insecurity: Not on file  Transportation Needs: Not on file  Physical Activity: Not on file  Stress: Not on file  Social Connections: Not on file    Review of Systems  Constitutional: Negative for activity change and appetite change.  HENT: Negative for congestion and rhinorrhea.   Eyes: Negative for visual disturbance.  Respiratory: Negative for shortness of breath.   Cardiovascular: Negative for chest pain, palpitations and leg swelling.  Gastrointestinal: Negative for abdominal distention and abdominal pain.  Endocrine: Negative for polyuria.  Genitourinary: Negative for difficulty urinating and dysuria.       ED  Musculoskeletal: Negative for arthralgias and back pain.  Skin: Negative.   Neurological: Negative.   Psychiatric/Behavioral: Negative.      Objective:  BP 120/80   Pulse 72   Temp 98.6 F (37 C)  Wt 275 lb (124.7 kg)   BMI 44.39 kg/m   BP/Weight 07/25/2020 06/27/2020 1/49/7026  Systolic BP 378 588 502  Diastolic BP 80 80 74  Wt. (Lbs) 275 275 283  BMI 44.39 44.39 45.68    Physical Exam Vitals reviewed.  Constitutional:      General: He is not in acute distress.    Appearance: Normal appearance. He is obese.  HENT:     Right Ear: Tympanic membrane normal.     Left Ear: Tympanic membrane normal.     Nose: Nose normal.     Mouth/Throat:     Mouth: Mucous membranes are moist.  Eyes:     Extraocular Movements: Extraocular movements intact.     Conjunctiva/sclera: Conjunctivae normal.     Pupils: Pupils are  equal, round, and reactive to light.  Cardiovascular:     Rate and Rhythm: Normal rate.     Pulses: Normal pulses.     Heart sounds: No murmur heard. No gallop.   Pulmonary:     Effort: Pulmonary effort is normal. No respiratory distress.     Breath sounds: No rales.  Abdominal:     General: Abdomen is flat. Bowel sounds are normal. There is no distension.     Palpations: Abdomen is soft.     Tenderness: There is no abdominal tenderness.  Genitourinary:    Comments: ED Musculoskeletal:        General: Normal range of motion.     Cervical back: Normal range of motion and neck supple.  Skin:    General: Skin is warm.     Capillary Refill: Capillary refill takes less than 2 seconds.  Neurological:     General: No focal deficit present.     Mental Status: He is alert and oriented to person, place, and time.  Psychiatric:        Mood and Affect: Mood normal.        Thought Content: Thought content normal.        Lab Results  Component Value Date   WBC 14.2 (H) 04/24/2020   HGB 15.2 04/24/2020   HCT 45.0 04/24/2020   PLT 251 04/24/2020   GLUCOSE 84 04/24/2020   CHOL 153 04/13/2019   TRIG 150 (H) 04/13/2019   HDL 42 04/13/2019   LDLCALC 85 04/13/2019   ALT 35 12/03/2019   AST 33 12/03/2019   NA 140 04/24/2020   K 4.2 04/24/2020   CL 101 04/24/2020   CREATININE 1.28 (H) 04/24/2020   BUN 19 04/24/2020   CO2 22 04/24/2020   TSH 4.480 06/24/2019      Assessment & Plan:   Diagnoses and all orders for this visit: Essential hypertension -     CBC with Differential/Platelet -     Comprehensive metabolic panel An individual hypertension care plan was established and reinforced today.  The patient's status was assessed using clinical findings on exam and labs or diagnostic tests. The patient's success at meeting treatment goals on disease specific evidence-based guidelines and found to be fair controlled. SELF MANAGEMENT: The patient and I together assessed ways to  personally work towards obtaining the recommended goals. RECOMMENDATIONS: avoid decongestants found in common cold remedies, decrease consumption of alcohol, perform routine monitoring of BP with home BP cuff, exercise, reduction of dietary salt, take medicines as prescribed, try not to miss doses and quit smoking.  Regular exercise and maintaining a healthy weight is needed.  Stress reduction may help. A CLINICAL SUMMARY including written plan  identify barriers to care unique to individual due to social or financial issues.  We attempt to mutually creat solutions for individual and family understanding.  Hereditary hemochromatosis ([place]) AN INDIVIDUAL CARE PLAN for hemachromatosis was established and reinforced today.  The patient's status was assessed using clinical findings on exam, labs, and other diagnostic testing. Patient's success at meeting treatment goals based on disease specific evidence-bassed guidelines and found to be in fair control. RECOMMENDATIONS include maintaining present medicines and treatment.  BMI 45.0-49.9, adult [facility]) An individualize plan was formulated for obesity using patient history and physical exam to encourage weight loss.  An evidence based program was formulated.  Patient is to cut portion sie with meals and to plan physical exercise 3 days a week at least 20 minutes.  Weight watchers and other programs are helpful.  Planned amount of weight loss 10 lbs.  Morbid obesity ([place]) An individualize plan was formulated for obesity using patient history and physical exam to encourage weight loss.  An evidence based program was formulated.  Patient is to cut portion size with meals and to plan physical exercise 3 days a week at least 20 minutes.  Weight watchers and other programs are helpful.  Planned amount of weight loss 10 lbs.  Hypogonadism in male -     PSA -     Testosterone Free, Profile I -     FSH/LH AN INDIVIDUAL CARE PLAN hypogonadism was established and  reinforced today.  The patient's status was assessed using clinical findings on exam, labs, and other diagnostic testing. Patient's success at meeting treatment goals based on disease specific evidence-bassed guidelines and found to be in poor control. RECOMMENDATIONS include maintaining present medicines and treatment.  Mixed hyperlipidemia -     Lipid panel AN INDIVIDUAL CARE PLAN for hyperlipidemia/ cholesterol was established and reinforced today.  The patient's status was assessed using clinical findings on exam, lab and other diagnostic tests. The patient's disease status was assessed based on evidence-based guidelines and found to be fair controlled. MEDICATIONS were reviewed. SELF MANAGEMENT GOALS have been discussed and patient's success at attaining the goal of low cholesterol was assessed. RECOMMENDATION given include regular exercise 3 days a week and low cholesterol/low fat diet. CLINICAL SUMMARY including written plan to identify barriers unique to the patient due to social or economic  reasons was discussed.  Erectile dysfunction, unspecified erectile dysfunction type -     sildenafil (VIAGRA) 100 MG tablet; Take 1 tablet (100 mg total) by mouth daily as needed for erectile dysfunction. AN INDIVIDUAL CARE PLAN for ED was established and reinforced today.  The patient's status was assessed using clinical findings on exam, labs, and other diagnostic testing. Patient's success at meeting treatment goals based on disease specific evidence-bassed guidelines and found to be in poor control. RECOMMENDATIONS include start viagra present medicines and treatment.        Follow-up: Return in about 3 months (around 10/25/2020) for fasting.  An After Visit Summary was printed and given to the patient.  Reinaldo Meeker, MD [facility] [phone]

## 2020-07-26 ENCOUNTER — Other Ambulatory Visit: Payer: Self-pay | Admitting: Legal Medicine

## 2020-07-26 DIAGNOSIS — N529 Male erectile dysfunction, unspecified: Secondary | ICD-10-CM

## 2020-07-26 DIAGNOSIS — E291 Testicular hypofunction: Secondary | ICD-10-CM

## 2020-07-26 LAB — COMPREHENSIVE METABOLIC PANEL
ALT: 26 IU/L (ref 0–44)
AST: 18 IU/L (ref 0–40)
Albumin/Globulin Ratio: 2 (ref 1.2–2.2)
Albumin: 4.6 g/dL (ref 4.0–5.0)
Alkaline Phosphatase: 84 IU/L (ref 44–121)
BUN/Creatinine Ratio: 23 — ABNORMAL HIGH (ref 9–20)
BUN: 19 mg/dL (ref 6–24)
Bilirubin Total: 0.4 mg/dL (ref 0.0–1.2)
CO2: 24 mmol/L (ref 20–29)
Calcium: 9.5 mg/dL (ref 8.7–10.2)
Chloride: 102 mmol/L (ref 96–106)
Creatinine, Ser: 0.83 mg/dL (ref 0.76–1.27)
Globulin, Total: 2.3 g/dL (ref 1.5–4.5)
Glucose: 98 mg/dL (ref 65–99)
Potassium: 4.3 mmol/L (ref 3.5–5.2)
Sodium: 141 mmol/L (ref 134–144)
Total Protein: 6.9 g/dL (ref 6.0–8.5)
eGFR: 109 mL/min/{1.73_m2} (ref >59)

## 2020-07-26 LAB — CBC WITH DIFFERENTIAL/PLATELET
Basophils Absolute: 0 10*3/uL (ref 0.0–0.2)
Basos: 0 %
EOS (ABSOLUTE): 0.3 10*3/uL (ref 0.0–0.4)
Eos: 4 %
Hematocrit: 43 % (ref 37.5–51.0)
Hemoglobin: 14.6 g/dL (ref 13.0–17.7)
Immature Grans (Abs): 0 10*3/uL (ref 0.0–0.1)
Immature Granulocytes: 0 %
Lymphocytes Absolute: 2 10*3/uL (ref 0.7–3.1)
Lymphs: 26 %
MCH: 31.3 pg (ref 26.6–33.0)
MCHC: 34 g/dL (ref 31.5–35.7)
MCV: 92 fL (ref 79–97)
Monocytes Absolute: 0.6 10*3/uL (ref 0.1–0.9)
Monocytes: 8 %
Neutrophils Absolute: 4.7 10*3/uL (ref 1.4–7.0)
Neutrophils: 62 %
Platelets: 257 10*3/uL (ref 150–450)
RBC: 4.66 x10E6/uL (ref 4.14–5.80)
RDW: 12.4 % (ref 11.6–15.4)
WBC: 7.6 10*3/uL (ref 3.4–10.8)

## 2020-07-26 LAB — LIPID PANEL
Chol/HDL Ratio: 3.8 ratio (ref 0.0–5.0)
Cholesterol, Total: 159 mg/dL (ref 100–199)
HDL: 42 mg/dL (ref >39)
LDL Chol Calc (NIH): 94 mg/dL (ref 0–99)
Triglycerides: 128 mg/dL (ref 0–149)
VLDL Cholesterol Cal: 23 mg/dL (ref 5–40)

## 2020-07-26 LAB — FSH/LH
FSH: 2.7 m[IU]/mL (ref 1.5–12.4)
LH: 2.8 m[IU]/mL (ref 1.7–8.6)

## 2020-07-26 LAB — TESTOSTERONE FREE, PROFILE I
Sex Hormone Binding: 22.8 nmol/L (ref 16.5–55.9)
Testost., Free, Calc: 27.7 pg/mL — ABNORMAL LOW (ref 30.3–183.2)
Testosterone: 127 ng/dL — ABNORMAL LOW (ref 264–916)

## 2020-07-26 LAB — PSA: Prostate Specific Ag, Serum: 0.9 ng/mL (ref 0.0–4.0)

## 2020-07-26 LAB — CARDIOVASCULAR RISK ASSESSMENT

## 2020-07-26 MED ORDER — SILDENAFIL CITRATE 100 MG PO TABS: 100.0000 mg | ORAL_TABLET | Freq: Every day | ORAL | 3 refills | Status: DC | PRN

## 2020-07-26 MED ORDER — TESTOSTERONE CYPIONATE 200 MG/ML IJ SOLN: 100.0000 mg | INTRAMUSCULAR | 0 refills | Status: DC

## 2020-07-26 NOTE — Progress Notes (Signed)
CBC normal, kidney and liver normal, glucose normal, Cholesterol normal, PSA 0.9 normal, total and free testosterone low- can replace with gel applied to body daily of injection every 2 to 3 weeks. lp

## 2020-07-26 NOTE — Progress Notes (Signed)
[place] and LH normal, so no pituitary failure so clomid not needed lp

## 2020-07-26 NOTE — Progress Notes (Signed)
Sent both to [facility] lp

## 2020-09-11 ENCOUNTER — Other Ambulatory Visit: Payer: Self-pay | Admitting: Oncology

## 2020-09-11 NOTE — Progress Notes (Deleted)
  [facility]  [address] [place],  [place]  [address] [phone]  Clinic Day:  09/11/2020  Referring physician: Lillard Anes,*   HISTORY OF PRESENT ILLNESS:  The patient is a 45 y.o. 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  The patient has been undergoing phlebotomies with the goal being to get his ferritin below 50.  He comes in today to reassess his iron parameters after receiving additional phlebotomies over these past few weeks.  Overall, the patient claims to be doing fairly well.  He denies having any systemic symptoms, which concern him for complications related to his underlying hemochromatosis.     PHYSICAL EXAM:  There were no vitals taken for this visit. Wt Readings from Last 3 Encounters:  07/25/20 275 lb (124.7 kg)  06/27/20 275 lb (124.7 kg)  04/24/20 283 lb (128.4 kg)   There is no height or weight on file to calculate BMI. Performance status (ECOG): {CHL ONC [id number] Physical Exam  LABS:   CBC Latest Ref Rng & Units 07/25/2020 04/24/2020 12/03/2019  WBC 3.4 - 10.8 x10E3/uL 7.6 14.2(H) 11.0  Hemoglobin 13.0 - 17.7 g/dL 14.6 15.2 14.6  Hematocrit 37.5 - 51.0 % 43.0 45.0 43  Platelets 150 - 450 x10E3/uL 257 251 281   CMP Latest Ref Rng & Units 07/25/2020 04/24/2020 12/03/2019  Glucose 65 - 99 mg/dL 98 84 -  BUN 6 - 24 mg/dL 19 19 19   Creatinine 0.76 - 1.27 mg/dL 0.83 1.28(H) 1.0  Sodium 134 - 144 mmol/L 141 140 142  Potassium 3.5 - 5.2 mmol/L 4.3 4.2 3.9  Chloride 96 - 106 mmol/L 102 101 107  CO2 20 - 29 mmol/L 24 22 26(A)  Calcium 8.7 - 10.2 mg/dL 9.5 9.3 9.0  Total Protein 6.0 - 8.5 g/dL 6.9 - -  Total Bilirubin 0.0 - 1.2 mg/dL 0.4 - -  Alkaline Phos 44 - 121 IU/L 84 - 77  AST 0 - 40 IU/L 18 - 33  ALT 0 - 44 IU/L 26 - 35     No results found for: CEA1 / No results found for: CEA1 Lab Results  Component Value Date   PSA1 0.9 07/25/2020   No results found for: JZP915 No results found for: AVW979  No  results found for: TOTALPROTELP, ALBUMINELP, A1GS, A2GS, BETS, BETA2SER, GAMS, MSPIKE, SPEI Lab Results  Component Value Date   TIBC 229 (L) 04/24/2020   TIBC 223 12/03/2019   FERRITIN 394 04/24/2020   FERRITIN 167 12/03/2019   FERRITIN 552 (H) 04/13/2019   IRONPCTSAT 33 04/24/2020   IRONPCTSAT 42.6 12/03/2019   No results found for: LDH     Component Value Date/Time   PSA1 0.9 07/25/2020 0812    Recent Review Flowsheet Data     Oncology Labs Latest Ref Rng & Units 12/03/2019 04/24/2020 07/25/2020   FERRITIN 30 - 400 ng/mL 167 394 -   IRONPCTSAT 15 - 55 % 42.6 33 -   PSA1 0.0 - 4.0 ng/mL - - 0.9        STUDIES:  No results found.    ASSESSMENT & PLAN:   Assessment/Plan:  A 45 y.o. male with hemochromatosis (C282Y/C82Y).     The patient understands all the plans discussed today and is in agreement with them.      Sarim Rothman Macarthur Critchley, MD

## 2020-09-12 ENCOUNTER — Telehealth: Payer: Self-pay | Admitting: Oncology

## 2020-09-12 ENCOUNTER — Inpatient Hospital Stay: Payer: No Typology Code available for payment source | Admitting: Oncology

## 2020-09-12 ENCOUNTER — Inpatient Hospital Stay: Payer: No Typology Code available for payment source

## 2020-09-12 NOTE — Telephone Encounter (Signed)
Patient called to Canceled today's Appt due to being called to work.  He will call back in Aug to reschedule

## 2020-09-18 ENCOUNTER — Ambulatory Visit: Payer: No Typology Code available for payment source | Admitting: Legal Medicine

## 2020-09-20 ENCOUNTER — Other Ambulatory Visit: Payer: Self-pay | Admitting: Cardiology

## 2020-09-20 DIAGNOSIS — I10 Essential (primary) hypertension: Secondary | ICD-10-CM

## 2020-09-25 ENCOUNTER — Other Ambulatory Visit: Payer: Self-pay

## 2020-09-25 DIAGNOSIS — I10 Essential (primary) hypertension: Secondary | ICD-10-CM

## 2020-09-25 MED ORDER — AMLODIPINE BESYLATE 5 MG PO TABS: 5.0000 mg | ORAL_TABLET | Freq: Every day | ORAL | 2 refills | Status: DC

## 2020-09-27 ENCOUNTER — Telehealth: Payer: Self-pay

## 2020-09-27 NOTE — Telephone Encounter (Signed)
Mr. Gikas called back stating that he missed a call. I spoke with Dr. Blanch Media nurse Lemmie Evens regarding the pt. She stated that he needs a face to face appointment for cpap f.up. I spoke with the pt regarding the appointment. He stated he will call back to schedule the appointment.

## 2020-10-02 ENCOUNTER — Other Ambulatory Visit: Payer: Self-pay

## 2020-10-02 DIAGNOSIS — N529 Male erectile dysfunction, unspecified: Secondary | ICD-10-CM

## 2020-10-02 DIAGNOSIS — E291 Testicular hypofunction: Secondary | ICD-10-CM

## 2020-10-02 MED ORDER — TESTOSTERONE CYPIONATE 200 MG/ML IJ SOLN: 100.0000 mg | INTRAMUSCULAR | 3 refills | Status: DC

## 2020-10-02 MED ORDER — SILDENAFIL CITRATE 100 MG PO TABS: 100.0000 mg | ORAL_TABLET | Freq: Every day | ORAL | 3 refills | Status: DC | PRN

## 2020-10-12 ENCOUNTER — Telehealth: Payer: Self-pay

## 2020-10-12 ENCOUNTER — Other Ambulatory Visit: Payer: Self-pay | Admitting: Legal Medicine

## 2020-10-12 DIAGNOSIS — E291 Testicular hypofunction: Secondary | ICD-10-CM

## 2020-10-12 MED ORDER — TESTOSTERONE CYPIONATE 200 MG/ML IJ SOLN: 100.0000 mg | INTRAMUSCULAR | 3 refills | Status: DC

## 2020-10-12 NOTE — Telephone Encounter (Signed)
Pt calling requesting supplies for testosterone. States wife will be giving testosterone shots. Please advise.   Harrell Lark 10/12/20 8:58 AM

## 2020-10-19 ENCOUNTER — Other Ambulatory Visit: Payer: Self-pay

## 2020-10-20 ENCOUNTER — Inpatient Hospital Stay (HOSPITAL_BASED_OUTPATIENT_CLINIC_OR_DEPARTMENT_OTHER): Payer: No Typology Code available for payment source | Admitting: Oncology

## 2020-10-20 ENCOUNTER — Inpatient Hospital Stay: Payer: No Typology Code available for payment source | Admitting: Oncology

## 2020-10-20 ENCOUNTER — Encounter: Payer: Self-pay | Admitting: Oncology

## 2020-10-20 ENCOUNTER — Inpatient Hospital Stay: Payer: No Typology Code available for payment source | Attending: Oncology

## 2020-10-20 LAB — COMPREHENSIVE METABOLIC PANEL
Albumin: 4.2 (ref 3.5–5.0)
Calcium: 8.8 (ref 8.7–10.7)

## 2020-10-20 LAB — IRON AND TIBC
Iron: 97 ug/dL (ref 45–182)
Saturation Ratios: 39 % (ref 17.9–39.5)
TIBC: 248 ug/dL — ABNORMAL LOW (ref 250–450)
UIBC: 151 ug/dL

## 2020-10-20 LAB — BASIC METABOLIC PANEL
BUN: 17 (ref 4–21)
CO2: 25 — AB (ref 13–22)
Chloride: 105 (ref 99–108)
Creatinine: 0.8 (ref 0.6–1.3)
Glucose: 80
Potassium: 4.2 (ref 3.4–5.3)
Sodium: 140 (ref 137–147)

## 2020-10-20 LAB — HEPATIC FUNCTION PANEL
ALT: 26 (ref 10–40)
AST: 30 (ref 14–40)
Alkaline Phosphatase: 75 (ref 25–125)
Bilirubin, Total: 0.4

## 2020-10-20 LAB — CBC AND DIFFERENTIAL
HCT: 43 (ref 41–53)
Hemoglobin: 14.7 (ref 13.5–17.5)
Neutrophils Absolute: 5.94
Platelets: 257 (ref 150–399)
WBC: 9

## 2020-10-20 LAB — FERRITIN: Ferritin: 184 ng/mL (ref 24–336)

## 2020-10-20 LAB — CBC: RBC: 4.58 (ref 3.87–5.11)

## 2020-10-23 ENCOUNTER — Encounter: Payer: Self-pay | Admitting: Oncology

## 2020-10-23 ENCOUNTER — Encounter: Payer: Self-pay | Admitting: Cardiology

## 2020-10-23 ENCOUNTER — Inpatient Hospital Stay: Payer: No Typology Code available for payment source

## 2020-10-23 ENCOUNTER — Other Ambulatory Visit: Payer: Self-pay

## 2020-10-23 ENCOUNTER — Other Ambulatory Visit: Payer: Self-pay | Admitting: Oncology

## 2020-10-23 ENCOUNTER — Ambulatory Visit (INDEPENDENT_AMBULATORY_CARE_PROVIDER_SITE_OTHER): Payer: No Typology Code available for payment source | Admitting: Cardiology

## 2020-10-23 VITALS — BP 126/80 | HR 93 | Ht 67.0 in | Wt 282.6 lb

## 2020-10-23 DIAGNOSIS — R072 Precordial pain: Secondary | ICD-10-CM | POA: Diagnosis not present

## 2020-10-23 DIAGNOSIS — I10 Essential (primary) hypertension: Secondary | ICD-10-CM | POA: Diagnosis not present

## 2020-10-23 DIAGNOSIS — G4733 Obstructive sleep apnea (adult) (pediatric): Secondary | ICD-10-CM | POA: Diagnosis not present

## 2020-10-23 MED ORDER — ALTEPLASE 2 MG IJ SOLR
2.0000 mg | Freq: Once | INTRAMUSCULAR | Status: AC | PRN
  Administered 2020-10-23: 2 mg

## 2020-10-23 MED ORDER — ALTEPLASE 2 MG IJ SOLR: 2.0000 mg | Freq: Once | INTRAMUSCULAR | Status: AC

## 2020-10-23 NOTE — Patient Instructions (Signed)
Therapeutic Phlebotomy Therapeutic phlebotomy is the planned removal of blood from a person's body for the purpose of treating a medical condition. The procedure is similar to donating blood. Usually, about a pint (470 mL, or 0.47 L) of blood is removed.The average adult has 9-12 pints (4.3-5.7 L) of blood in the body. Therapeutic phlebotomy may be used to treat the following medical conditions: Hemochromatosis. This is a condition in which the blood contains too much iron. Polycythemia vera. This is a condition in which the blood contains too many red blood cells. Porphyria cutanea tarda. This is a disease in which an important part of hemoglobin is not made properly. It results in the buildup of abnormal amounts of porphyrins in the body. Sickle cell disease. This is a condition in which the red blood cells form an abnormal crescent shape rather than a round shape. Tell a health care provider about: Any allergies you have. All medicines you are taking, including vitamins, herbs, eye drops, creams, and over-the-counter medicines. Any problems you or family members have had with anesthetic medicines. Any blood disorders you have. Any surgeries you have had. Any medical conditions you have. Whether you are pregnant or may be pregnant. What are the risks? Generally, this is a safe procedure. However, problems may occur, including: Nausea or light-headedness. Low blood pressure (hypotension). Soreness, bleeding, swelling, or bruising at the needle insertion site. Infection. What happens before the procedure? Follow instructions from your health care provider about eating or drinking restrictions. Ask your health care provider about: Changing or stopping your regular medicines. This is especially important if you are taking diabetes medicines or blood thinners (anticoagulants). Taking medicines such as aspirin and ibuprofen. These medicines can thin your blood. Do not take these medicines unless  your health care provider tells you to take them. Taking over-the-counter medicines, vitamins, herbs, and supplements. Wear clothing with sleeves that can be raised above the elbow. Plan to have someone take you home from the hospital or clinic. You may have a blood sample taken. Your blood pressure, pulse rate, and breathing rate will be measured. What happens during the procedure?  To lower your risk of infection: Your health care team will wash or sanitize their hands. Your skin will be cleaned with an antiseptic. You may be given a medicine to numb the area (local anesthetic). A tourniquet will be placed on your arm. A needle will be inserted into one of your veins. Tubing and a collection bag will be attached to that needle. Blood will flow through the needle and tubing into the collection bag. The collection bag will be placed lower than your arm to allow gravity to help the flow of blood into the bag. You may be asked to open and close your hand slowly and continually during the entire collection. After the specified amount of blood has been removed from your body, the collection bag and tubing will be clamped. The needle will be removed from your vein. Pressure will be held on the site of the needle insertion to stop the bleeding. A bandage (dressing) will be placed over the needle insertion site. The procedure may vary among health care providers and hospitals. What happens after the procedure? Your blood pressure, pulse rate, and breathing rate will be measured after the procedure. You will be encouraged to drink fluids. Your recovery will be assessed and monitored. You can return to your normal activities as told by your health care provider. Summary Therapeutic phlebotomy is the planned removal of   blood from a person's body for the purpose of treating a medical condition. Therapeutic phlebotomy may be used to treat hemochromatosis, polycythemia vera, porphyria cutanea tarda,  or sickle cell disease. In the procedure, a needle is inserted and about a pint (470 mL, or 0.47 L) of blood is removed. The average adult has 9-12 pints (4.3-5.7 L) of blood in the body. This is generally a safe procedure, but it can sometimes cause problems such as nausea, light-headedness, or low blood pressure (hypotension). This information is not intended to replace advice given to you by your health care provider. Make sure you discuss any questions you have with your healthcare provider. Document Revised: 03/06/2017 Document Reviewed: 03/06/2017 Elsevier Patient Education  2022 [employer]  

## 2020-10-23 NOTE — Progress Notes (Signed)
Cardiology Office Note:    Date:  10/23/2020   ID:  Austin Gilbert, DOB 12/19/1975, MRN [id number]  PCP:  Lillard Anes, MD  Cardiologist:  Jenne Campus, MD    Referring MD: Lillard Anes,*   Chief Complaint  Patient presents with   Follow-up  I am doing well  History of Present Illness:    Austin Gilbert is a 45 y.o. male  with history of hemochromatosis, benign essential hypertension, dyslipidemia.  Comes today to my office for follow-up.  Last time I see he was because of atypical chest pain.  Year ago he had a stress test which was normal, he also got echocardiogram which was normal this time we decided to do calcium score which came as a 0.  He comes today to talk about his issues overall doing well denies have any chest pain tightness squeezing pressure burning chest.  He works a lot of hours, he does not think occasional that he is tired of it and he decided to cut down hours of working he tells me that he works up to 15 hours a day.  Denies have any cardiac complaints.  Past Medical History:  Diagnosis Date   COVID-19 03/2020   Epilepsy [facility])    Hemochromatosis    Other hemochromatosis     History reviewed. No pertinent surgical history.  Current Medications: Current Meds  Medication Sig   amLODipine (NORVASC) 5 MG tablet Take 1 tablet (5 mg total) by mouth daily.   cholecalciferol (VITAMIN D3) 25 MCG (1000 UNIT) tablet Take 1,000 Units by mouth daily.   meloxicam (MOBIC) 15 MG tablet Take 15 mg by mouth daily.   Multiple Vitamin (MULTIVITAMIN) tablet Take 1 tablet by mouth daily.   sildenafil (VIAGRA) 100 MG tablet Take 1 tablet (100 mg total) by mouth daily as needed for erectile dysfunction.   Testosterone Cypionate 200 MG/ML SOLN Inject 100 mg as directed every 14 (fourteen) days.   Zinc 100 MG TABS Take 1 tablet by mouth daily.     Allergies:   Patient has no known allergies.   Social History   Socioeconomic History   Marital  status: Married    Spouse name: Not on file   Number of children: Not on file   Years of education: Not on file   Highest education level: Not on file  Occupational History    Employer: [employer]  Tobacco Use   Smoking status: Never   Smokeless tobacco: Never  Vaping Use   Vaping Use: Never used  Substance and Sexual Activity   Alcohol use: Not Currently   Drug use: Not Currently    Types: Marijuana    Comment: Experimented with   Sexual activity: Yes    Partners: Female  Other Topics Concern   Not on file  Social History Narrative   Not on file   Social Determinants of Health   Financial Resource Strain: Not on file  Food Insecurity: Not on file  Transportation Needs: Not on file  Physical Activity: Not on file  Stress: Not on file  Social Connections: Not on file     Family History: The patient's family history includes COPD in his father; Heart disease in his mother; Hyperlipidemia in his mother; Hypertension in his mother; Testicular cancer in his brother; Transient ischemic attack in his mother. ROS:   Please see the history of present illness.    All 14 point review of systems negative except as described per  history of present illness  EKGs/Labs/Other Studies Reviewed:      Recent Labs: 10/20/2020: ALT 26; BUN 17; Creatinine 0.8; Hemoglobin 14.7; Platelets 257; Potassium 4.2; Sodium 140  Recent Lipid Panel    Component Value Date/Time   CHOL 159 07/25/2020 0812   TRIG 128 07/25/2020 0812   HDL 42 07/25/2020 0812   CHOLHDL 3.8 07/25/2020 0812   LDLCALC 94 07/25/2020 0812    Physical Exam:    VS:  BP 126/80 (BP Location: Right Arm, Patient Position: Sitting)   Pulse 93   Ht '5\' 7"'$  (1.702 m)   Wt 282 lb 9.6 oz (128.2 kg)   SpO2 94%   BMI 44.26 kg/m     Wt Readings from Last 3 Encounters:  10/23/20 282 lb 9.6 oz (128.2 kg)  10/23/20 280 lb (127 kg)  10/20/20 278 lb (126.1 kg)     GEN:  Well nourished, well developed in no acute  distress HEENT: Normal NECK: No JVD; No carotid bruits LYMPHATICS: No lymphadenopathy CARDIAC: RRR, no murmurs, no rubs, no gallops RESPIRATORY:  Clear to auscultation without rales, wheezing or rhonchi  ABDOMEN: Soft, non-tender, non-distended MUSCULOSKELETAL:  No edema; No deformity  SKIN: Warm and dry LOWER EXTREMITIES: no swelling NEUROLOGIC:  Alert and oriented x 3 PSYCHIATRIC:  Normal affect   ASSESSMENT:    1. Essential hypertension   2. OSA (obstructive sleep apnea)   3. Morbid obesity ([place])   4. Precordial pain   5. Hemochromatosis, unspecified hemochromatosis type    PLAN:    In order of problems listed above:  Atypical chest pain, stress test negative last year at this time calcium score done which is 0.  The key is risk factors modification I told him to let me know if he develop any symptoms. Obstructive sleep apnea with CPAP mask.  Followed by antimedicine team. Morbid obesity problem he understand he is trying to work on it. Hemochromatosis followed by antimedicine team.   Medication Adjustments/Labs and Tests Ordered: Current medicines are reviewed at length with the patient today.  Concerns regarding medicines are outlined above.  No orders of the defined types were placed in this encounter.  Medication changes: No orders of the defined types were placed in this encounter.   Signed, Park Liter, MD, [facility] 10/23/2020 3:54 PM    [place]

## 2020-10-23 NOTE — Patient Instructions (Signed)
Medication Instructions:  Your physician recommends that you continue on your current medications as directed. Please refer to the Current Medication list given to you today.  *If you need a refill on your cardiac medications before your next appointment, please call your pharmacy*   Lab Work: NONE If you have labs (blood work) drawn today and your tests are completely normal, you will receive your results only by: [place] (if you have MyChart) OR A paper copy in the mail If you have any lab test that is abnormal or we need to change your treatment, we will call you to review the results.   Testing/Procedures: NONE   Follow-Up: At [facility], you and your health needs are our priority.  As part of our continuing mission to provide you with exceptional heart care, we have created designated Provider Care Teams.  These Care Teams include your primary Cardiologist (physician) and Advanced Practice Providers (APPs -  Physician Assistants and Nurse Practitioners) who all work together to provide you with the care you need, when you need it.  We recommend signing up for the patient portal called "MyChart".  Sign up information is provided on this After Visit Summary.  MyChart is used to connect with patients for Virtual Visits (Telemedicine).  Patients are able to view lab/test results, encounter notes, upcoming appointments, etc.  Non-urgent messages can be sent to your provider as well.   To learn more about what you can do with MyChart, go to [email or web address].    Your next appointment:   1 year(s)  The format for your next appointment:   In Person  Provider:   Jenne Campus, MD   Other Instructions

## 2020-10-23 NOTE — Progress Notes (Signed)
Austin Gilbert presents today for phlebotomy per MD orders. Phlebotomy procedure started at 1440 and ended at 1450. 480 mL removed thru Portacath. Pt had alteplase instilled at 1405, good blood return at 1440. Patient tolerated procedure well. IV needle removed intact.

## 2020-10-24 ENCOUNTER — Encounter: Payer: Self-pay | Admitting: Oncology

## 2020-10-26 ENCOUNTER — Ambulatory Visit: Payer: No Typology Code available for payment source | Admitting: Legal Medicine

## 2020-10-26 ENCOUNTER — Encounter: Payer: Self-pay | Admitting: Oncology

## 2020-10-28 ENCOUNTER — Encounter: Payer: Self-pay | Admitting: Oncology

## 2020-10-28 NOTE — Progress Notes (Signed)
  [place]  [address] [place],  [place]  [address] [phone]  Clinic Day:  10/20/2020  Referring physician: Lillard Anes,*   HISTORY OF PRESENT ILLNESS:  The patient is a 45 y.o. male with 45 year old gentleman with hemochromatosis (C282Y/C282Y).  The patient has been undergoing phlebotomies with the goal being to get his ferritin below 50.  He comes in for a virtual visit      today to reassess his iron parameters after receiving additional phlebotomies over these past few weeks.  Overall, the patient claims to be doing fairly well.  He denies having any systemic symptoms which concern him for complications related to his underlying hemochromatosis.   LABS:   CBC Latest Ref Rng & Units 10/20/2020 07/25/2020 04/24/2020  WBC - 9.0 7.6 14.2(H)  Hemoglobin 13.5 - 17.5 14.7 14.6 15.2  Hematocrit 41 - 53 43 43.0 45.0  Platelets 150 - 399 257 257 251   CMP Latest Ref Rng & Units 10/20/2020 07/25/2020 04/24/2020  Glucose 65 - 99 mg/dL - 98 84  BUN 4 - '21 17 19 19  '$ Creatinine 0.6 - 1.3 0.8 0.83 1.28(H)  Sodium 137 - 147 140 141 140  Potassium 3.4 - 5.3 4.2 4.3 4.2  Chloride 99 - 108 105 102 101  CO2 13 - 22 25(A) 24 22  Calcium 8.7 - 10.7 8.8 9.5 9.3  Total Protein 6.0 - 8.5 g/dL - 6.9 -  Total Bilirubin 0.0 - 1.2 mg/dL - 0.4 -  Alkaline Phos 25 - 125 75 84 -  AST 14 - 40 30 18 -  ALT 10 - 40 26 26 -    Ref. Range 10/20/2020 10:25  Iron Latest Ref Range: 45 - 182 ug/dL 97  UIBC Latest Units: ug/dL 151  TIBC Latest Ref Range: 250 - 450 ug/dL 248 (L)  Saturation Ratios Latest Ref Range: 17.9 - 39.5 % 39  Ferritin Latest Ref Range: 24 - 336 ng/mL 184    ASSESSMENT & PLAN:  Assessment/Plan:  A 45 y.o. male with hemochromatosis (C282Y/C82Y).  Although his iron parameters are somewhat improved, his ferritin remains well above 50.  Based upon this, I will arrange for him to be phlebotomized monthly, including next week.  I will see him back  in 3 months to reassess his iron parameters to see how well his iron levels have responded to his upcoming monthly phlebotomies.  The patient understands all the plans discussed today and is in agreement with them.    Jadda Hunsucker Macarthur Critchley, MD

## 2020-11-17 ENCOUNTER — Telehealth: Payer: Self-pay | Admitting: Oncology

## 2020-11-17 NOTE — Telephone Encounter (Signed)
Patient called to reschedule 9/23 Phlebotomy Therapy Appt Time to 3:30 pm

## 2020-11-24 ENCOUNTER — Inpatient Hospital Stay: Payer: No Typology Code available for payment source

## 2020-12-04 ENCOUNTER — Telehealth: Payer: Self-pay | Admitting: Oncology

## 2020-12-04 NOTE — Telephone Encounter (Signed)
12/04/20 Spoke with patient and scheduled next appt.

## 2020-12-08 ENCOUNTER — Telehealth: Payer: Self-pay | Admitting: Oncology

## 2020-12-08 NOTE — Telephone Encounter (Signed)
12/08/20 Patient called and cancelled all appts until Dec.2022.

## 2021-01-19 ENCOUNTER — Telehealth: Payer: No Typology Code available for payment source | Admitting: Oncology

## 2021-01-19 ENCOUNTER — Other Ambulatory Visit: Payer: No Typology Code available for payment source

## 2021-02-09 NOTE — Progress Notes (Signed)
  [place]  [address] [place],  [place]  [address] [phone]  Clinic Day:  02/16/2021  Referring physician: Lillard Anes,*  This document serves as a record of services personally performed by Marice Potter, MD. It was created on their behalf by [facility] E, a trained medical scribe. The creation of this record is based on the scribe's personal observations and the provider's statements to them.  HISTORY OF PRESENT ILLNESS:  The patient is a 45 y.o. male with 45 year old gentleman with hemochromatosis (C282Y/C282Y).  The patient has been undergoing phlebotomies with the goal being to get his ferritin below 50.  However, he has not had any over these past few months.  He comes in today to reassess his iron parameters.  Overall, the patient claims to be doing fairly well.  He denies having any systemic symptoms which concern him for complications related to his underlying hemochromatosis.  LABS:    Latest Reference Range & Units 02/16/21 11:20 02/16/21 11:30  Iron 45 - 182 ug/dL 220 (H)   UIBC ug/dL 35   TIBC 250 - 450 ug/dL 255   Saturation Ratios 17.9 - 39.5 % 86 (H)   Ferritin 24 - 336 ng/mL  200  (H): Data is abnormally high  Latest Reference Range & Units 02/16/21 00:00  WBC  10.2 (E)  RBC 3.87 - 5.11  5.03 (E)  Hemoglobin 13.5 - 17.5  15.4 (E)  HCT 41 - 53  46 (E)  MCV 80 - 94  92 (E)  Platelets 150 - 399  278 (E)   ASSESSMENT & PLAN:  Assessment/Plan:  A 45 y.o. male with hemochromatosis (C282Y/C82Y).  His iron parameters remain elevated, as his ferritin remains well above 50.  Based upon this, I will arrange for him to be phlebotomized every 2 weeks over these next few months.  I will see him back in 8 weeks to reassess his iron parameters to see how well his iron levels have responded to his upcoming biweekly  phlebotomies.  The patient understands all the plans discussed today and is in agreement with them.   I,  Rita Ohara, am acting as scribe for Marice Potter, MD    I have reviewed this report as typed by the medical scribe, and it is complete and accurate.  Tylena Prisk Macarthur Critchley, MD

## 2021-02-16 ENCOUNTER — Inpatient Hospital Stay: Payer: No Typology Code available for payment source | Attending: Oncology

## 2021-02-16 ENCOUNTER — Inpatient Hospital Stay: Payer: No Typology Code available for payment source

## 2021-02-16 ENCOUNTER — Other Ambulatory Visit: Payer: Self-pay | Admitting: Oncology

## 2021-02-16 ENCOUNTER — Other Ambulatory Visit: Payer: Self-pay | Admitting: Hematology and Oncology

## 2021-02-16 ENCOUNTER — Inpatient Hospital Stay (HOSPITAL_BASED_OUTPATIENT_CLINIC_OR_DEPARTMENT_OTHER): Payer: No Typology Code available for payment source | Admitting: Oncology

## 2021-02-16 DIAGNOSIS — Z79899 Other long term (current) drug therapy: Secondary | ICD-10-CM | POA: Insufficient documentation

## 2021-02-16 LAB — IRON AND TIBC
Iron: 220 ug/dL — ABNORMAL HIGH (ref 45–182)
Saturation Ratios: 86 % — ABNORMAL HIGH (ref 17.9–39.5)
TIBC: 255 ug/dL (ref 250–450)
UIBC: 35 ug/dL

## 2021-02-16 LAB — CBC
MCV: 92 (ref 80–94)
RBC: 5.03 (ref 3.87–5.11)

## 2021-02-16 LAB — FERRITIN: Ferritin: 200 ng/mL (ref 24–336)

## 2021-02-16 LAB — CBC AND DIFFERENTIAL
HCT: 46 (ref 41–53)
Hemoglobin: 15.4 (ref 13.5–17.5)
Neutrophils Absolute: 6.83
Platelets: 278 (ref 150–399)
WBC: 10.2

## 2021-02-18 ENCOUNTER — Encounter: Payer: Self-pay | Admitting: Oncology

## 2021-02-19 ENCOUNTER — Telehealth: Payer: Self-pay | Admitting: Oncology

## 2021-02-19 NOTE — Telephone Encounter (Signed)
Per 12/18 Staff Msg, patient scheduled for every 2 wks Phlebotomy Therapy, Labs 2/16, Follow Up 2/17.  Patient notified - Mailed Appt Calendars

## 2021-02-22 ENCOUNTER — Other Ambulatory Visit: Payer: Self-pay

## 2021-02-22 ENCOUNTER — Inpatient Hospital Stay: Payer: No Typology Code available for payment source

## 2021-02-22 ENCOUNTER — Other Ambulatory Visit: Payer: Self-pay | Admitting: Hematology and Oncology

## 2021-02-22 NOTE — Progress Notes (Signed)
UNABLE TO OBTAIN BLOOD RETURN FROM THE PORTACATH.  PT WANTS TO RESCHEDULE TO TOMORROW MORNING.

## 2021-02-23 ENCOUNTER — Inpatient Hospital Stay: Payer: No Typology Code available for payment source

## 2021-02-23 MED ORDER — ALTEPLASE 2 MG IJ SOLR
2.0000 mg | Freq: Once | INTRAMUSCULAR | Status: AC
  Administered 2021-02-23: 2 mg

## 2021-02-23 NOTE — Progress Notes (Signed)
Austin Gilbert presents today for phlebotomy per MD orders. Phlebotomy procedure started at 0946 and ended at 6. 488 grams removed. Patient observed after procedure without any incident. Patient tolerated procedure well. IV needle removed intact from PAC  Good blood return after cathflo instilled for 1 hour 10 minutes. Flushed per protocol and deaccessed

## 2021-03-09 ENCOUNTER — Inpatient Hospital Stay: Payer: No Typology Code available for payment source | Attending: Oncology

## 2021-03-09 ENCOUNTER — Other Ambulatory Visit: Payer: Self-pay

## 2021-03-09 NOTE — Patient Instructions (Signed)

## 2021-03-09 NOTE — Progress Notes (Signed)
Port flushed with 60ml NS and 500U Heparin

## 2021-03-09 NOTE — Progress Notes (Signed)
Austin Gilbert presents today for phlebotomy per MD orders. Phlebotomy procedure started at 1540 and ended at 1550  500 cc removed. Patient tolerated procedure well. IV needle removed intact.

## 2021-03-09 NOTE — Addendum Note (Signed)
Addended by: Maxwell Marion on: 03/09/2021 04:15 PM   Modules accepted: Orders

## 2021-03-28 ENCOUNTER — Other Ambulatory Visit: Payer: Self-pay | Admitting: Pharmacist

## 2021-03-28 ENCOUNTER — Other Ambulatory Visit: Payer: Self-pay

## 2021-03-28 ENCOUNTER — Inpatient Hospital Stay: Payer: No Typology Code available for payment source

## 2021-03-28 NOTE — Patient Instructions (Signed)

## 2021-03-28 NOTE — Progress Notes (Signed)
[place] flushed with 10cc NS and 500 Units of Heparin

## 2021-03-28 NOTE — Progress Notes (Signed)
Austin Gilbert presents today for phlebotomy per MD orders. Phlebotomy procedure started at 1550 and ended at 1605 . 500  cc removed. Patient tolerated procedure well. IV needle removed intact.

## 2021-04-11 ENCOUNTER — Other Ambulatory Visit: Payer: Self-pay

## 2021-04-11 ENCOUNTER — Inpatient Hospital Stay: Payer: No Typology Code available for payment source | Attending: Oncology

## 2021-04-11 MED ORDER — SODIUM CHLORIDE 0.9% FLUSH
10.0000 mL | INTRAVENOUS | Status: DC | PRN
  Administered 2021-04-11: 10 mL

## 2021-04-11 MED ORDER — ALTEPLASE 2 MG IJ SOLR: 2.0000 mg | Freq: Once | INTRAMUSCULAR | Status: DC | PRN

## 2021-04-11 MED ORDER — HEPARIN SOD (PORK) LOCK FLUSH 100 UNIT/ML IV SOLN
500.0000 [IU] | Freq: Once | INTRAVENOUS | Status: AC | PRN
  Administered 2021-04-11: 500 [IU]

## 2021-04-11 NOTE — Progress Notes (Signed)
Austin Gilbert presents today for phlebotomy per MD orders. Phlebotomy procedure started at 1515 and ended at 1545. 450 cc removed. Patient tolerated procedure well. IV needle removed intact.

## 2021-04-11 NOTE — Patient Instructions (Signed)

## 2021-04-18 ENCOUNTER — Other Ambulatory Visit: Payer: No Typology Code available for payment source

## 2021-04-18 ENCOUNTER — Ambulatory Visit: Payer: No Typology Code available for payment source | Admitting: Oncology

## 2021-04-19 ENCOUNTER — Other Ambulatory Visit: Payer: No Typology Code available for payment source

## 2021-04-20 ENCOUNTER — Ambulatory Visit: Payer: No Typology Code available for payment source | Admitting: Oncology

## 2021-04-23 NOTE — Progress Notes (Signed)
°  [place]  [address] [place],  [place]  [address] [phone]  Clinic Day:  04/27/2021  Referring physician: Lillard Anes,*  This document serves as a record of services personally performed by Austin Potter, MD. It was created on their behalf by [facility] E, a trained medical scribe. The creation of this record is based on the scribe's personal observations and the provider's statements to them.  HISTORY OF PRESENT ILLNESS:  The patient is a 46 y.o. male with 46 year old gentleman with hemochromatosis (C282Y/C282Y).  The patient has been undergoing phlebotomies every 2 weeks over these past months with the goal being to get his ferritin below 50.  However, he has not had any over these past few months.  He comes in today to reassess his iron parameters.  Overall, the patient claims to be doing fairly well.  He denies having any systemic symptoms which concern him for complications related to his underlying hemochromatosis.  LABS:    Latest Reference Range & Units 04/27/21 00:00  WBC  10.2  RBC 3.87 - 5.11  4.21  Hemoglobin 13.5 - 17.5  14.0  HCT 41 - 53  39 !  Platelets 150 - 399  253  NEUT#  6.12    Latest Reference Range & Units 04/27/21 13:40  Iron 45 - 182 ug/dL 77  UIBC ug/dL 189  TIBC 250 - 450 ug/dL 266  Saturation Ratios 17.9 - 39.5 % 29  Ferritin 24 - 336 ng/mL 102   ASSESSMENT & PLAN:  Assessment/Plan:  A 46 y.o. male with hemochromatosis (C282Y/C82Y).  Although better, his iron parameters are not ideal, particularly as the goal is to get his ferritin below 50.  Based upon this, I will arrange for him to be phlebotomized, but it will be just once monthly over these next few months.  I will see him back in 3 months to reassess his iron parameters to see how well his iron levels have responded to his upcoming monthly phlebotomies.  The patient understands all the plans discussed today and is in agreement with  them.  I, Rita Ohara, am acting as scribe for Austin Potter, MD    I have reviewed this report as typed by the medical scribe, and it is complete and accurate.  Austin Harbeck Macarthur Critchley, MD

## 2021-04-27 ENCOUNTER — Other Ambulatory Visit: Payer: Self-pay | Admitting: Oncology

## 2021-04-27 ENCOUNTER — Encounter: Payer: Self-pay | Admitting: Oncology

## 2021-04-27 ENCOUNTER — Inpatient Hospital Stay (HOSPITAL_BASED_OUTPATIENT_CLINIC_OR_DEPARTMENT_OTHER): Payer: No Typology Code available for payment source | Admitting: Oncology

## 2021-04-27 ENCOUNTER — Inpatient Hospital Stay: Payer: No Typology Code available for payment source

## 2021-04-27 ENCOUNTER — Other Ambulatory Visit: Payer: Self-pay

## 2021-04-27 LAB — IRON AND TIBC
Iron: 77 ug/dL (ref 45–182)
Saturation Ratios: 29 % (ref 17.9–39.5)
TIBC: 266 ug/dL (ref 250–450)
UIBC: 189 ug/dL

## 2021-04-27 LAB — BASIC METABOLIC PANEL
BUN: 15 (ref 4–21)
CO2: 26 — AB (ref 13–22)
Chloride: 107 (ref 99–108)
Creatinine: 0.8 (ref 0.6–1.3)
Glucose: 85
Potassium: 3.9 (ref 3.4–5.3)
Sodium: 140 (ref 137–147)

## 2021-04-27 LAB — CBC AND DIFFERENTIAL
HCT: 39 — AB (ref 41–53)
Hemoglobin: 14 (ref 13.5–17.5)
Neutrophils Absolute: 6.12
Platelets: 253 (ref 150–399)
WBC: 10.2

## 2021-04-27 LAB — HEPATIC FUNCTION PANEL
ALT: 30 (ref 10–40)
AST: 32 (ref 14–40)
Alkaline Phosphatase: 72 (ref 25–125)
Bilirubin, Total: 0.3

## 2021-04-27 LAB — COMPREHENSIVE METABOLIC PANEL
Albumin: 4.2 (ref 3.5–5.0)
Calcium: 8.5 — AB (ref 8.7–10.7)

## 2021-04-27 LAB — CBC: RBC: 4.21 (ref 3.87–5.11)

## 2021-04-27 LAB — FERRITIN: Ferritin: 102 ng/mL (ref 24–336)

## 2021-04-29 ENCOUNTER — Other Ambulatory Visit: Payer: Self-pay | Admitting: Oncology

## 2021-04-29 ENCOUNTER — Encounter: Payer: Self-pay | Admitting: Oncology

## 2021-06-15 ENCOUNTER — Encounter: Payer: Self-pay | Admitting: Hematology and Oncology

## 2021-06-15 ENCOUNTER — Other Ambulatory Visit: Payer: Self-pay

## 2021-06-15 ENCOUNTER — Inpatient Hospital Stay: Payer: No Typology Code available for payment source | Attending: Oncology | Admitting: Hematology and Oncology

## 2021-06-15 NOTE — Progress Notes (Signed)
?Patient Care Team: ?Lillard Anes, MD as PCP - General (Family Medicine) ? ?Clinic Day:  06/15/2021 ? ?Referring physician: Lillard Anes,* ? ?ASSESSMENT & PLAN:  ? ?Assessment & Plan: ?Hereditary hemochromatosis ([place]) ?A 46 y.o. male with hemochromatosis (C282Y/C82Y).  He was receiving phlebotomies every 2 weeks while undergoing treatment in [place]. He has transferred care here and wishes for Dr. Hinton Rao to take over his care. His most recent labs were in February of this year and CBC revealed hemoglobin 14.0 and hematocrit 39.2. Ferritin was 102. His last phlebotomy was in January. We will plan for return visit in May with CBC, CMP and ferritin at that time. Both the patient and his wife are agreeable to this plan. He will need his labs drawn from his port at this next visit.  ? ?The patient understands the plans discussed today and is in agreement with them.  He knows to contact our office if he develops concerns prior to his next appointment. ?  ? ? ?Melodye Ped, NP  ?[place] ?[place] ?[place] [address] ?Dept: [phone] ?Dept Fax: [phone]  ? ?No orders of the defined types were placed in this encounter. ?  ? ? ?CHIEF COMPLAINT:  ?CC: A 46 year old male with history of hemochromatosis here for 3 month evaluation  ? ?Current Treatment:  Surveillance ? ?INTERVAL HISTORY:  ?Joshua is here today for repeat clinical assessment. He denies fevers or chills. He denies pain. His appetite is good. His weight has been stable. ? ?I have reviewed the past medical history, past surgical history, social history and family history with the patient and they are unchanged from previous note. ? ?ALLERGIES:  has No Known Allergies. ? ?MEDICATIONS:  ?Current Outpatient Medications  ?Medication Sig Dispense Refill  ? Multiple Vitamin (MULTIVITAMIN) tablet Take 1 tablet by mouth daily.    ? amLODipine (NORVASC) 5 MG tablet Take  1 tablet (5 mg total) by mouth daily. 90 tablet 2  ? cholecalciferol (VITAMIN D3) 25 MCG (1000 UNIT) tablet Take 1,000 Units by mouth daily. (Patient not taking: Reported on 06/15/2021)    ? meloxicam (MOBIC) 15 MG tablet Take 15 mg by mouth daily. (Patient not taking: Reported on 06/15/2021)    ? sildenafil (VIAGRA) 100 MG tablet Take 1 tablet (100 mg total) by mouth daily as needed for erectile dysfunction. (Patient not taking: Reported on 06/15/2021) 10 tablet 3  ? Testosterone Cypionate 200 MG/ML SOLN Inject 100 mg as directed every 14 (fourteen) days. 5 mL 3  ? Zinc 100 MG TABS Take 1 tablet by mouth daily. (Patient not taking: Reported on 06/15/2021)    ? ?No current facility-administered medications for this visit.  ? ?Facility-Administered Medications Ordered in Other Visits  ?Medication Dose Route Frequency Provider Last Rate Last Admin  ? alteplase (CATHFLO ACTIVASE) injection 2 mg  2 mg Intracatheter Once Marice Potter, MD      ? ? ?HISTORY OF PRESENT ILLNESS:  ? ?Oncology History  ? No history exists.  ?  ? ? ?REVIEW OF SYSTEMS:  ? ?Constitutional: Denies fevers, chills or abnormal weight loss ?Eyes: Denies blurriness of vision ?Ears, nose, mouth, throat, and face: Denies mucositis or sore throat ?Respiratory: Denies cough, dyspnea or wheezes ?Cardiovascular: Denies palpitation, chest discomfort or lower extremity swelling ?Gastrointestinal:  Denies nausea, heartburn or change in bowel habits ?Skin: Denies abnormal skin rashes ?Lymphatics: Denies new lymphadenopathy or easy bruising ?Neurological:Denies numbness, tingling or  new weaknesses ?Behavioral/Psych: Mood is stable, no new changes  ?All other systems were reviewed with the patient and are negative. ? ? ?VITALS:  ?Blood pressure 124/89, pulse 74, temperature 98.1 ?F (36.7 ?C), temperature source Oral, resp. rate 20, height '5\' 7"'$  (1.702 m), weight 293 lb 11.2 oz (133.2 kg), SpO2 95 %.  ?Wt Readings from Last 3 Encounters:  ?06/15/21 293 lb 11.2 oz  (133.2 kg)  ?04/27/21 292 lb 14.4 oz (132.9 kg)  ?03/28/21 299 lb 1.3 oz (135.7 kg)  ?  ?Body mass index is 46 kg/m?. ? ?Performance status (ECOG): 1 - Symptomatic but completely ambulatory ? ?PHYSICAL EXAM:  ? ?GENERAL:alert, no distress and comfortable ?SKIN: skin color, texture, turgor are normal, no rashes or significant lesions ?EYES: normal, Conjunctiva are pink and non-injected, sclera clear ?OROPHARYNX:no exudate, no erythema and lips, buccal mucosa, and tongue normal  ?NECK: supple, thyroid normal size, non-tender, without nodularity ?LYMPH:  no palpable lymphadenopathy in the cervical, axillary or inguinal ?LUNGS: clear to auscultation and percussion with normal breathing effort ?HEART: regular rate & rhythm and no murmurs and no lower extremity edema ?ABDOMEN:abdomen soft, non-tender and normal bowel sounds ?Musculoskeletal:no cyanosis of digits and no clubbing  ?NEURO: alert & oriented x 3 with fluent speech, no focal motor/sensory deficits ? ?LABORATORY DATA:  ?I have reviewed the data as listed ?   ?Component Value Date/Time  ? NA 140 04/27/2021 0000  ? K 3.9 04/27/2021 0000  ? CL 107 04/27/2021 0000  ? CO2 26 (A) 04/27/2021 0000  ? GLUCOSE 98 07/25/2020 0812  ? BUN 15 04/27/2021 0000  ? CREATININE 0.8 04/27/2021 0000  ? CREATININE 0.83 07/25/2020 0812  ? CALCIUM 8.5 (A) 04/27/2021 0000  ? PROT 6.9 07/25/2020 0812  ? ALBUMIN 4.2 04/27/2021 0000  ? ALBUMIN 4.6 07/25/2020 0812  ? AST 32 04/27/2021 0000  ? ALT 30 04/27/2021 0000  ? ALKPHOS 72 04/27/2021 0000  ? BILITOT 0.4 07/25/2020 0812  ? GFRNONAA 67 04/24/2020 1654  ? GFRAA 77 04/24/2020 1654  ? ? ?No results found for: SPEP, UPEP ? ?Lab Results  ?Component Value Date  ? WBC 10.2 04/27/2021  ? NEUTROABS 6.12 04/27/2021  ? HGB 14.0 04/27/2021  ? HCT 39 (A) 04/27/2021  ? MCV 92 02/16/2021  ? PLT 253 04/27/2021  ? ? ?  Chemistry   ?   ?Component Value Date/Time  ? NA 140 04/27/2021 0000  ? K 3.9 04/27/2021 0000  ? CL 107 04/27/2021 0000  ? CO2 26 (A)  04/27/2021 0000  ? BUN 15 04/27/2021 0000  ? CREATININE 0.8 04/27/2021 0000  ? CREATININE 0.83 07/25/2020 0812  ? GLU 85 04/27/2021 0000  ?    ?Component Value Date/Time  ? CALCIUM 8.5 (A) 04/27/2021 0000  ? ALKPHOS 72 04/27/2021 0000  ? AST 32 04/27/2021 0000  ? ALT 30 04/27/2021 0000  ? BILITOT 0.4 07/25/2020 8416  ?  ? ? ? ?RADIOGRAPHIC STUDIES: ?I have personally reviewed the radiological images as listed and agreed with the findings in the report. ?No results found. ?

## 2021-06-15 NOTE — Assessment & Plan Note (Signed)
A 46 y.o. male with hemochromatosis (C282Y/C82Y).  He was receiving phlebotomies every 2 weeks while undergoing treatment in [place]. He has transferred care here and wishes for Dr. Hinton Rao to take over his care. His most recent labs were in February of this year and CBC revealed hemoglobin 14.0 and hematocrit 39.2. Ferritin was 102. His last phlebotomy was in January. We will plan for return visit in May with CBC, CMP and ferritin at that time. Both the patient and his wife are agreeable to this plan. He will need his labs drawn from his port at this next visit. ?

## 2021-06-26 ENCOUNTER — Other Ambulatory Visit: Payer: Self-pay | Admitting: Legal Medicine

## 2021-06-26 DIAGNOSIS — I10 Essential (primary) hypertension: Secondary | ICD-10-CM

## 2021-07-25 ENCOUNTER — Other Ambulatory Visit: Payer: No Typology Code available for payment source

## 2021-07-26 ENCOUNTER — Ambulatory Visit: Payer: No Typology Code available for payment source | Admitting: Oncology

## 2021-08-03 ENCOUNTER — Inpatient Hospital Stay: Payer: No Typology Code available for payment source | Attending: Oncology

## 2021-08-03 DIAGNOSIS — Z79899 Other long term (current) drug therapy: Secondary | ICD-10-CM | POA: Insufficient documentation

## 2021-08-03 LAB — IRON AND TIBC
Iron: 121 ug/dL (ref 45–182)
Saturation Ratios: 51 % — ABNORMAL HIGH (ref 17.9–39.5)
TIBC: 235 ug/dL — ABNORMAL LOW (ref 250–450)
UIBC: 114 ug/dL

## 2021-08-03 LAB — CBC: RBC: 4.57 (ref 3.87–5.11)

## 2021-08-03 LAB — HEPATIC FUNCTION PANEL
ALT: 29 U/L (ref 10–40)
AST: 26 (ref 14–40)
Alkaline Phosphatase: 66 (ref 25–125)
Bilirubin, Total: 0.4

## 2021-08-03 LAB — BASIC METABOLIC PANEL
BUN: 17 (ref 4–21)
CO2: 26 — AB (ref 13–22)
Chloride: 104 (ref 99–108)
Creatinine: 0.7 (ref 0.6–1.3)
Glucose: 94
Potassium: 3.9 mEq/L (ref 3.5–5.1)
Sodium: 139 (ref 137–147)

## 2021-08-03 LAB — CBC AND DIFFERENTIAL
HCT: 41 (ref 41–53)
Hemoglobin: 13.7 (ref 13.5–17.5)
Neutrophils Absolute: 4.34
Platelets: 242 10*3/uL (ref 150–400)
WBC: 7

## 2021-08-03 LAB — COMPREHENSIVE METABOLIC PANEL
Albumin: 3.9 (ref 3.5–5.0)
Calcium: 8.5 — AB (ref 8.7–10.7)

## 2021-08-03 LAB — FERRITIN: Ferritin: 101 ng/mL (ref 24–336)

## 2021-08-03 MED ORDER — SODIUM CHLORIDE 0.9% FLUSH
10.0000 mL | INTRAVENOUS | Status: DC | PRN
  Administered 2021-08-03: 10 mL

## 2021-08-03 MED ORDER — HEPARIN SOD (PORK) LOCK FLUSH 100 UNIT/ML IV SOLN
500.0000 [IU] | Freq: Once | INTRAVENOUS | Status: AC | PRN
  Administered 2021-08-03: 500 [IU]

## 2021-08-29 ENCOUNTER — Telehealth: Payer: Self-pay | Admitting: Cardiology

## 2021-08-29 NOTE — Telephone Encounter (Signed)
Spoke with pt who states that 2 weeks ago he had an episode of chest pain, dizziness and chest pain. Pt presented to [facility] ED 08/06/21 at 2213 for same BP was 167/81, HR 89, R 20 and 96% sat. Pt had an EKG, chest, Xray and labs but left without treatment at 2345. I have printed labs, Xray and EKG for review.  Pt states that last pm he had an episode after bending over and raising up where he became dizzy but no chest pain and his BP was elevated as seen in note. Pt reports this am that he feels good and his BP is normal. Advised to make sure he is drinking plenty of water and decreasing salt intake. Advised to change positions slowly. Pt verbalized understanding and had no additional questions.  Please advise.

## 2021-08-29 NOTE — Telephone Encounter (Signed)
Pt c/o BP issue: STAT if pt c/o blurred vision, one-sided weakness or slurred speech  1. What are your last 5 BP readings? Last night 160/100, took amlodipine and layed down 140/93, this morning 125/85  2. Are you having any other symptoms (ex. Dizziness, headache, blurred vision, passed out)? Dizziness, SOB not now, headaches  3. What is your BP issue? Patient's wife states the patient's BP has been high and he gets episodes of dizziness and SOB. She says he will just all of a sudden not feel well.

## 2021-08-30 NOTE — Telephone Encounter (Signed)
Patient called checking on status of his call.

## 2021-08-30 NOTE — Telephone Encounter (Signed)
Pt has an appointment to discuss. Advised to bring BP log to appointment.

## 2021-08-31 ENCOUNTER — Encounter: Payer: Self-pay | Admitting: Cardiology

## 2021-08-31 ENCOUNTER — Ambulatory Visit (INDEPENDENT_AMBULATORY_CARE_PROVIDER_SITE_OTHER): Payer: No Typology Code available for payment source | Admitting: Cardiology

## 2021-08-31 ENCOUNTER — Ambulatory Visit (INDEPENDENT_AMBULATORY_CARE_PROVIDER_SITE_OTHER): Payer: No Typology Code available for payment source

## 2021-08-31 VITALS — BP 136/80 | HR 89 | Ht 67.0 in | Wt 294.8 lb

## 2021-08-31 DIAGNOSIS — G4733 Obstructive sleep apnea (adult) (pediatric): Secondary | ICD-10-CM | POA: Diagnosis not present

## 2021-08-31 DIAGNOSIS — R42 Dizziness and giddiness: Secondary | ICD-10-CM

## 2021-08-31 DIAGNOSIS — I10 Essential (primary) hypertension: Secondary | ICD-10-CM

## 2021-08-31 DIAGNOSIS — R0609 Other forms of dyspnea: Secondary | ICD-10-CM

## 2021-08-31 DIAGNOSIS — R072 Precordial pain: Secondary | ICD-10-CM

## 2021-08-31 NOTE — Patient Instructions (Signed)
Medication Instructions: Your physician recommends that you continue on your current medications as directed. Please refer to the Current Medication list given to you today.    *If you need a refill on your cardiac medications before your next appointment, please call your pharmacy*   Lab Work: NONE If you have labs (blood work) drawn today and your tests are completely normal, you will receive your results only by: [place] (if you have MyChart) OR A paper copy in the mail If you have any lab test that is abnormal or we need to change your treatment, we will call you to review the results.   Testing/Procedures: Your physician has requested that you have an echocardiogram. Echocardiography is a painless test that uses sound waves to create images of your heart. It provides your doctor with information about the size and shape of your heart and how well your heart's chambers and valves are working. This procedure takes approximately one hour. There are no restrictions for this procedure.   You have been asked to wear a Zio Heart Monitor today. It is to be worn for 14 days. Please remove the monitor on  July 14th      and mail back in the box provided.  If you have any questions about the monitor please call the company at [phone]     Follow-Up: At [facility], you and your health needs are our priority.  As part of our continuing mission to provide you with exceptional heart care, we have created designated Provider Care Teams.  These Care Teams include your primary Cardiologist (physician) and Advanced Practice Providers (APPs -  Physician Assistants and Nurse Practitioners) who all work together to provide you with the care you need, when you need it.  We recommend signing up for the patient portal called "MyChart".  Sign up information is provided on this After Visit Summary.  MyChart is used to connect with patients for Virtual Visits (Telemedicine).  Patients are able to  view lab/test results, encounter notes, upcoming appointments, etc.  Non-urgent messages can be sent to your provider as well.   To learn more about what you can do with MyChart, go to [email or web address].    Your next appointment:   3 month(s)  The format for your next appointment:   In Person  Provider:   Jenne Campus, MD    Other Instructions   Important Information About Sugar

## 2021-08-31 NOTE — Progress Notes (Signed)
Cardiology Office Note:    Date:  08/31/2021   ID:  Austin Gilbert, DOB 29-Oct-1975, MRN [id number]  PCP:  Austin Anes, MD  Cardiologist:  Austin Campus, MD    Referring MD: Austin Gilbert,*   Chief Complaint  Patient presents with   Dizziness   Shortness of Breath        Hypertension    History of Present Illness:    Austin Gilbert is a 46 y.o. male with past medical history significant for obstructive sleep apnea, morbid obesity, essential hypertension, hemochromatosis, comes to me for follow-up overall complain of having some shortness of breath he comes with his wife there is situation his blood pressure would be very high.  Sadly because of his big arm he check his blood pressure on forearm arm which could be a problem.  He also complained of having dizziness which happens when he tried to walk around.  Turning head make it slightly worse but he is very concerned about it.  Shortness of breath fatigue.  He works for [employer] still able to walk and climb stairs with no difficulties I will Past Medical History:  Diagnosis Date   COVID-19 03/2020   Epilepsy [facility])    Hemochromatosis    Other hemochromatosis     History reviewed. No pertinent surgical history.  Current Medications: Current Meds  Medication Sig   amLODipine (NORVASC) 5 MG tablet TAKE 1 TABLET (5 MG TOTAL) BY MOUTH DAILY.   Multiple Vitamin (MULTIVITAMIN) tablet Take 1 tablet by mouth daily.   [DISCONTINUED] cholecalciferol (VITAMIN D3) 25 MCG (1000 UNIT) tablet Take 1,000 Units by mouth daily.   [DISCONTINUED] meloxicam (MOBIC) 15 MG tablet Take 15 mg by mouth daily.   [DISCONTINUED] sildenafil (VIAGRA) 100 MG tablet Take 1 tablet (100 mg total) by mouth daily as needed for erectile dysfunction.   [DISCONTINUED] Zinc 100 MG TABS Take 1 tablet by mouth daily.     Allergies:   Patient has no known allergies.   Social History   Socioeconomic History   Marital  status: Married    Spouse name: Not on file   Number of children: Not on file   Years of education: Not on file   Highest education level: Not on file  Occupational History    Employer: [employer]  Tobacco Use   Smoking status: Never   Smokeless tobacco: Never  Vaping Use   Vaping Use: Never used  Substance and Sexual Activity   Alcohol use: Not Currently   Drug use: Not Currently    Types: Marijuana    Comment: Experimented with   Sexual activity: Yes    Partners: Female  Other Topics Concern   Not on file  Social History Narrative   Not on file   Social Determinants of Health   Financial Resource Strain: Not on file  Food Insecurity: Not on file  Transportation Needs: Not on file  Physical Activity: Not on file  Stress: Not on file  Social Connections: Not on file     Family History: The patient's family history includes COPD in his father; Heart disease in his mother; Hyperlipidemia in his mother; Hypertension in his mother; Testicular cancer in his brother; Transient ischemic attack in his mother. ROS:   Please see the history of present illness.    All 14 point review of systems negative except as described per history of present illness  EKGs/Labs/Other Studies Reviewed:      Recent Labs:  08/03/2021: ALT 29; BUN 17; Creatinine 0.7; Hemoglobin 13.7; Platelets 242; Potassium 3.9; Sodium 139  Recent Lipid Panel    Component Value Date/Time   CHOL 159 07/25/2020 0812   TRIG 128 07/25/2020 0812   HDL 42 07/25/2020 0812   CHOLHDL 3.8 07/25/2020 0812   LDLCALC 94 07/25/2020 0812    Physical Exam:    VS:  BP 136/80 (BP Location: Left Arm, Patient Position: Sitting)   Pulse 89   Ht '5\' 7"'$  (1.702 m)   Wt 294 lb 12.8 oz (133.7 kg)   SpO2 95%   BMI 46.17 kg/m     Wt Readings from Last 3 Encounters:  08/31/21 294 lb 12.8 oz (133.7 kg)  06/15/21 293 lb 11.2 oz (133.2 kg)  04/27/21 292 lb 14.4 oz (132.9 kg)     GEN:  Well nourished, well  developed in no acute distress HEENT: Normal NECK: No JVD; No carotid bruits LYMPHATICS: No lymphadenopathy CARDIAC: RRR, no murmurs, no rubs, no gallops RESPIRATORY:  Clear to auscultation without rales, wheezing or rhonchi  ABDOMEN: Soft, non-tender, non-distended MUSCULOSKELETAL:  No edema; No deformity  SKIN: Warm and dry LOWER EXTREMITIES: no swelling NEUROLOGIC:  Alert and oriented x 3 PSYCHIATRIC:  Normal affect   ASSESSMENT:    1. Precordial pain   2. Essential hypertension   3. OSA (obstructive sleep apnea)   4. Morbid obesity ([place])   5. DOE (dyspnea on exertion)   6. Hemochromatosis, unspecified hemochromatosis type   7. Dizziness    PLAN:    In order of problems listed above:  Precordial chest pain: Denies having any Essential hypertension I suspect a part of the problem is the fact that they measure blood pressure in the wrong way they have normal regular caffeine use on forearm.  I asked his wife to go to the pharmacy and simply get large cuff that he required to measure blood pressure appropriately.  I will not alter any of his medications today Dyspnea on exertion: I will ask him to have an echocardiogram done I will be looking for 11 to go hypertrophy.  Based on that we will decide what will be neck step which can include MRI Morbid obesity obviously problem he understand he is try to work on that he stopped drinking soda lost few pounds already Hemochromatosis: That being followed by internal medicine team as well as hematology. Dizziness: I will ask him to wear Zio patch for 2 weeks to make sure he does not arrhythmia related however from description he gave me I suspect more vertigo.   Medication Adjustments/Labs and Tests Ordered: Current medicines are reviewed at length with the patient today.  Concerns regarding medicines are outlined above.  Orders Placed This Encounter  Procedures   EKG 12-Lead   Medication changes: No orders of the defined types were  placed in this encounter.   Signed, Austin Liter, MD, [facility] 08/31/2021 10:11 AM    [place]

## 2021-08-31 NOTE — Addendum Note (Signed)
Addended by: Jerl Santos R on: 08/31/2021 10:25 AM   Modules accepted: Orders

## 2021-09-11 ENCOUNTER — Ambulatory Visit (INDEPENDENT_AMBULATORY_CARE_PROVIDER_SITE_OTHER): Payer: No Typology Code available for payment source

## 2021-09-11 DIAGNOSIS — R0609 Other forms of dyspnea: Secondary | ICD-10-CM | POA: Diagnosis not present

## 2021-09-11 DIAGNOSIS — R072 Precordial pain: Secondary | ICD-10-CM | POA: Diagnosis not present

## 2021-09-11 LAB — ECHOCARDIOGRAM COMPLETE
Area-P 1/2: 5.46 cm2
S' Lateral: 3 cm

## 2021-09-11 MED ORDER — PERFLUTREN LIPID MICROSPHERE
1.0000 mL | INTRAVENOUS | Status: AC | PRN
  Administered 2021-09-11: 6 mL via INTRAVENOUS

## 2021-09-14 ENCOUNTER — Telehealth: Payer: Self-pay

## 2021-09-14 NOTE — Telephone Encounter (Signed)
Patient complaining of high BP and dizziness. He was seen by cardiology on 6/28 due to these same symptoms. Due to continuance of symptoms recommended patient follow up with Cardiology for further advisement. He is to call back after their advisement if needed.   He will also call back to schedule a well appointment with Dr Henrene Pastor.   Royce Macadamia, [place] 09/14/21 10:56 AM

## 2021-12-03 ENCOUNTER — Encounter: Payer: Self-pay | Admitting: Cardiology

## 2021-12-03 ENCOUNTER — Ambulatory Visit: Payer: No Typology Code available for payment source | Attending: Cardiology | Admitting: Cardiology

## 2021-12-03 VITALS — BP 130/84 | HR 68 | Ht 67.0 in | Wt 296.8 lb

## 2021-12-03 DIAGNOSIS — G4733 Obstructive sleep apnea (adult) (pediatric): Secondary | ICD-10-CM | POA: Diagnosis not present

## 2021-12-03 DIAGNOSIS — E785 Hyperlipidemia, unspecified: Secondary | ICD-10-CM | POA: Diagnosis not present

## 2021-12-03 DIAGNOSIS — I10 Essential (primary) hypertension: Secondary | ICD-10-CM

## 2021-12-03 NOTE — Progress Notes (Signed)
Cardiology Office Note:    Date:  12/03/2021   ID:  Austin Gilbert, DOB 1976-03-01, MRN [id number]  PCP:  Lillard Anes, MD  Cardiologist:  Jenne Campus, MD    Referring MD: Lillard Anes,*   Chief Complaint  Patient presents with   Follow-up       Doing well  History of Present Illness:    Austin Gilbert is a 46 y.o. male with past medical history significant obstructive sleep apnea, on CPAP mask, morbid obesity, essential hypertension, hemochromatosis.  He is coming back to my office for follow-up.  Overall he is doing well.  He denies have any chest pain tightness squeezing pressure burning chest no shortness of breath.  Past Medical History:  Diagnosis Date   COVID-19 03/2020   Epilepsy [facility])    Hemochromatosis    Other hemochromatosis     History reviewed. No pertinent surgical history.  Current Medications: Current Meds  Medication Sig   amLODipine (NORVASC) 5 MG tablet TAKE 1 TABLET (5 MG TOTAL) BY MOUTH DAILY.   Multiple Vitamin (MULTIVITAMIN) tablet Take 1 tablet by mouth daily.     Allergies:   Patient has no known allergies.   Social History   Socioeconomic History   Marital status: Married    Spouse name: Not on file   Number of children: Not on file   Years of education: Not on file   Highest education level: Not on file  Occupational History    Employer: [employer]  Tobacco Use   Smoking status: Never   Smokeless tobacco: Never  Vaping Use   Vaping Use: Never used  Substance and Sexual Activity   Alcohol use: Not Currently   Drug use: Not Currently    Types: Marijuana    Comment: Experimented with   Sexual activity: Yes    Partners: Female  Other Topics Concern   Not on file  Social History Narrative   Not on file   Social Determinants of Health   Financial Resource Strain: Not on file  Food Insecurity: Not on file  Transportation Needs: Not on file  Physical Activity: Not on file  Stress:  Not on file  Social Connections: Not on file     Family History: The patient's family history includes COPD in his father; Heart disease in his mother; Hyperlipidemia in his mother; Hypertension in his mother; Testicular cancer in his brother; Transient ischemic attack in his mother. ROS:   Please see the history of present illness.    All 14 point review of systems negative except as described per history of present illness  EKGs/Labs/Other Studies Reviewed:      Recent Labs: 08/03/2021: ALT 29; BUN 17; Creatinine 0.7; Hemoglobin 13.7; Platelets 242; Potassium 3.9; Sodium 139  Recent Lipid Panel    Component Value Date/Time   CHOL 159 07/25/2020 0812   TRIG 128 07/25/2020 0812   HDL 42 07/25/2020 0812   CHOLHDL 3.8 07/25/2020 0812   LDLCALC 94 07/25/2020 0812    Physical Exam:    VS:  BP 130/84 (BP Location: Left Arm, Patient Position: Sitting)   Pulse 68   Ht '5\' 7"'$  (1.702 m)   Wt 296 lb 12.8 oz (134.6 kg)   SpO2 96%   BMI 46.49 kg/m     Wt Readings from Last 3 Encounters:  12/03/21 296 lb 12.8 oz (134.6 kg)  08/31/21 294 lb 12.8 oz (133.7 kg)  06/15/21 293 lb 11.2 oz (133.2 kg)  GEN:  Well nourished, well developed in no acute distress HEENT: Normal NECK: No JVD; No carotid bruits LYMPHATICS: No lymphadenopathy CARDIAC: RRR, no murmurs, no rubs, no gallops RESPIRATORY:  Clear to auscultation without rales, wheezing or rhonchi  ABDOMEN: Soft, non-tender, non-distended MUSCULOSKELETAL:  No edema; No deformity  SKIN: Warm and dry LOWER EXTREMITIES: no swelling NEUROLOGIC:  Alert and oriented x 3 PSYCHIATRIC:  Normal affect   ASSESSMENT:    1. Essential hypertension   2. OSA (obstructive sleep apnea)    PLAN:    In order of problems listed above:  Essential hypertension blood pressure well controlled continue present management.  He is on amlodipine which I will continue Obstructive sleep apnea: CPAP mask continue present management Dyslipidemia: I did  review his K PN which only his LDL 94 HDL 42 this is from May of this year of last year actually he is out of medication.  I will check his fasting lipid profile and decide about potential therapy We did talk about healthy lifestyle need to exercise on the regular basis I recommended 5 times a week 30 minutes moderate intensity exercise we briefly discussed diet   Medication Adjustments/Labs and Tests Ordered: Current medicines are reviewed at length with the patient today.  Concerns regarding medicines are outlined above.  No orders of the defined types were placed in this encounter.  Medication changes: No orders of the defined types were placed in this encounter.   Signed, Park Liter, MD, [facility] 12/03/2021 10:09 AM    [place]

## 2021-12-03 NOTE — Patient Instructions (Addendum)
Medication Instructions:  Your physician recommends that you continue on your current medications as directed. Please refer to the Current Medication list given to you today.  *If you need a refill on your cardiac medications before your next appointment, please call your pharmacy*   Lab Work: Lipid, AST, ALT- Today If you have labs (blood work) drawn today and your tests are completely normal, you will receive your results only by: [place] (if you have MyChart) OR A paper copy in the mail If you have any lab test that is abnormal or we need to change your treatment, we will call you to review the results.   Testing/Procedures: None Ordered   Follow-Up: At [facility], you and your health needs are our priority.  As part of our continuing mission to provide you with exceptional heart care, we have created designated Provider Care Teams.  These Care Teams include your primary Cardiologist (physician) and Advanced Practice Providers (APPs -  Physician Assistants and Nurse Practitioners) who all work together to provide you with the care you need, when you need it.  We recommend signing up for the patient portal called "MyChart".  Sign up information is provided on this After Visit Summary.  MyChart is used to connect with patients for Virtual Visits (Telemedicine).  Patients are able to view lab/test results, encounter notes, upcoming appointments, etc.  Non-urgent messages can be sent to your provider as well.   To learn more about what you can do with MyChart, go to [email or web address].    Your next appointment:   12 month(s)  The format for your next appointment:   In Person  Provider:   Jenne Campus, MD    Other Instructions NA eZZ

## 2021-12-10 ENCOUNTER — Ambulatory Visit (INDEPENDENT_AMBULATORY_CARE_PROVIDER_SITE_OTHER): Payer: No Typology Code available for payment source | Admitting: Nurse Practitioner

## 2021-12-10 ENCOUNTER — Encounter: Payer: Self-pay | Admitting: Nurse Practitioner

## 2021-12-10 VITALS — BP 110/82 | HR 77 | Temp 96.4°F | Ht 67.0 in | Wt 292.0 lb

## 2021-12-10 DIAGNOSIS — Z0000 Encounter for general adult medical examination without abnormal findings: Secondary | ICD-10-CM | POA: Diagnosis not present

## 2021-12-10 DIAGNOSIS — M21612 Bunion of left foot: Secondary | ICD-10-CM

## 2021-12-10 DIAGNOSIS — Z23 Encounter for immunization: Secondary | ICD-10-CM

## 2021-12-10 DIAGNOSIS — E66813 Obesity, class 3: Secondary | ICD-10-CM

## 2021-12-10 DIAGNOSIS — Z1211 Encounter for screening for malignant neoplasm of colon: Secondary | ICD-10-CM | POA: Diagnosis not present

## 2021-12-10 DIAGNOSIS — L989 Disorder of the skin and subcutaneous tissue, unspecified: Secondary | ICD-10-CM

## 2021-12-10 DIAGNOSIS — Z6841 Body Mass Index (BMI) 40.0 and over, adult: Secondary | ICD-10-CM

## 2021-12-10 NOTE — Progress Notes (Signed)
Subjective:  Patient ID: Austin Gilbert, male    DOB: 1975-07-24  Age: 46 y.o. MRN: [id number]  Chief Complaint  Patient presents with   Annual Exam    HPI Encounter for general adult medical examination with abnormal findings  Physical ("At Risk" items are starred): Patient's last physical exam was 18 months ago.      SDOH Screenings   Food Insecurity: No Food Insecurity (12/10/2021)  Housing: Low Risk  (12/10/2021)  Transportation Needs: No Transportation Needs (12/10/2021)  Utilities: Not At Risk (12/10/2021)  Alcohol Screen: Low Risk  (12/10/2021)  Depression (PHQ2-9): Low Risk  (12/10/2021)  Financial Resource Strain: Low Risk  (12/10/2021)  Physical Activity: Inactive (12/10/2021)  Social Connections: Moderately Integrated (12/10/2021)  Stress: No Stress Concern Present (12/10/2021)  Tobacco Use: Low Risk  (12/10/2021)       12/10/2021    8:57 AM 09/24/2019    7:53 AM  Fall Risk   Falls in the past year? 0 0  Number falls in past yr: 0 0  Injury with Fall? 0 0  Risk for fall due to : No Fall Risks   Follow up Falls evaluation completed Falls evaluation completed       12/10/2021    8:57 AM 09/24/2019    7:44 AM  Depression screen PHQ 2/9  Decreased Interest 0 0  Down, Depressed, Hopeless 0 0  PHQ - 2 Score 0 0  Altered sleeping 0   Tired, decreased energy 0   Change in appetite 0   Feeling bad or failure about yourself  0   Trouble concentrating 0   Moving slowly or fidgety/restless 0   Suicidal thoughts 0   PHQ-9 Score 0   Difficult doing work/chores Not difficult at all     Functional Status Survey: Is the patient deaf or have difficulty hearing?: No Does the patient have difficulty seeing, even when wearing glasses/contacts?: No Does the patient have difficulty concentrating, remembering, or making decisions?: No Does the patient have difficulty walking or climbing stairs?: No Does the patient have difficulty dressing or bathing?: No Does the patient have  difficulty doing errands alone such as visiting a doctor's office or shopping?: No   Safety: reviewed ;  Patient wears a seat belt. Patient's home has smoke detectors and carbon monoxide detectors. Patient practices appropriate gun safety Patient does not wear sunscreen with extended sun exposure. Dental Care: biannual cleanings, brushes and flosses daily. Ophthalmology/Optometry: Needs appt Hearing loss: none Vision impairments: none Patient is not afflicted from Stress Incontinence and Urge Incontinence   Current Outpatient Medications on File Prior to Visit  Medication Sig Dispense Refill   amLODipine (NORVASC) 5 MG tablet TAKE 1 TABLET (5 MG TOTAL) BY MOUTH DAILY. 30 tablet 6   Multiple Vitamin (MULTIVITAMIN) tablet Take 1 tablet by mouth daily.     Current Facility-Administered Medications on File Prior to Visit  Medication Dose Route Frequency Provider Last Rate Last Admin   alteplase (CATHFLO ACTIVASE) injection 2 mg  2 mg Intracatheter Once Lewis, Dequincy A, MD       sodium chloride flush (NS) 0.9 % injection 10 mL  10 mL Intracatheter PRN Bobby Rumpf, Dequincy A, MD   10 mL at 08/03/21 1884    Social Hx   Social History   Socioeconomic History   Marital status: Married    Spouse name: Not on file   Number of children: Not on file   Years of education: Not on file   Highest education  level: Not on file  Occupational History    Employer: [employer]  Tobacco Use   Smoking status: Never   Smokeless tobacco: Never  Vaping Use   Vaping Use: Never used  Substance and Sexual Activity   Alcohol use: Not Currently   Drug use: Not Currently    Types: Marijuana    Comment: Experimented with   Sexual activity: Yes    Partners: Female  Other Topics Concern   Not on file  Social History Narrative   Not on file   Social Determinants of Health   Financial Resource Strain: Low Risk  (12/10/2021)   Overall Financial Resource Strain (CARDIA)    Difficulty of Paying  Living Expenses: Not hard at all  Food Insecurity: No Food Insecurity (12/10/2021)   Hunger Vital Sign    Worried About Running Out of Food in the Last Year: Never true    Ran Out of Food in the Last Year: Never true  Transportation Needs: No Transportation Needs (12/10/2021)   PRAPARE - Hydrologist (Medical): No    Lack of Transportation (Non-Medical): No  Physical Activity: Inactive (12/10/2021)   Exercise Vital Sign    Days of Exercise per Week: 0 days    Minutes of Exercise per Session: 0 min  Stress: No Stress Concern Present (12/10/2021)   [place]    Feeling of Stress : Not at all  Social Connections: Moderately Integrated (12/10/2021)   Social Connection and Isolation Panel [NHANES]    Frequency of Communication with Friends and Family: More than three times a week    Frequency of Social Gatherings with Friends and Family: More than three times a week    Attends Religious Services: More than 4 times per year    Active Member of [employer] or Organizations: No    Attends Archivist Meetings: Never    Marital Status: Married   Past Medical History:  Diagnosis Date   COVID-19 03/2020   Epilepsy ([place])    Hemochromatosis    Other hemochromatosis    Family History  Problem Relation Age of Onset   Heart disease Mother    Hypertension Mother    Hyperlipidemia Mother    Transient ischemic attack Mother    COPD Father    Testicular cancer Brother     Review of Systems  Constitutional:  Negative for chills, diaphoresis, fatigue and fever.  HENT:  Negative for congestion, ear pain and sore throat.   Respiratory:  Negative for cough and shortness of breath.   Cardiovascular:  Negative for chest pain and leg swelling.  Gastrointestinal:  Negative for abdominal pain, constipation, diarrhea, nausea and vomiting.  Genitourinary:  Negative for dysuria and urgency.  Musculoskeletal:   Negative for arthralgias and myalgias.  Neurological:  Negative for dizziness and headaches.  Psychiatric/Behavioral:  Negative for dysphoric mood.      Objective:  BP 110/82   Pulse 77   Temp (!) 96.4 F (35.8 C)   Ht '5\' 7"'$  (1.702 m)   Wt 292 lb (132.5 kg)   SpO2 93%   BMI 45.73 kg/m       12/10/2021    8:55 AM 12/03/2021    9:50 AM 08/31/2021    9:50 AM  BP/Weight  Systolic BP  536 644  Diastolic BP  84 80  Wt. (Lbs) 292 296.8 294.8  BMI 45.73 kg/m2 46.49 kg/m2 46.17 kg/m2    Physical  Exam Vitals reviewed.  Constitutional:      Appearance: He is obese.  HENT:     Right Ear: Tympanic membrane normal.     Left Ear: Tympanic membrane normal.     Nose: Nose normal.     Mouth/Throat:     Mouth: Mucous membranes are moist.  Eyes:     Pupils: Pupils are equal, round, and reactive to light.  Cardiovascular:     Rate and Rhythm: Normal rate and regular rhythm.     Pulses: Normal pulses.          Dorsalis pedis pulses are 2+ on the right side and 2+ on the left side.       Posterior tibial pulses are 2+ on the right side and 2+ on the left side.     Heart sounds: Normal heart sounds.  Pulmonary:     Effort: Pulmonary effort is normal.     Breath sounds: Normal breath sounds.  Abdominal:     General: Bowel sounds are normal.     Palpations: Abdomen is soft.  Musculoskeletal:        General: Normal range of motion.     Right foot: Deformity (right second toe deformity) present.     Left foot: Deformity (left foot bunion deformity) present.  Feet:     Right foot:     Skin integrity: Callus present.     Left foot:     Skin integrity: Callus present.     Toenail Condition: Left toenails are abnormally thick. Fungal disease present.    Comments: Left great toenail fungus present Skin:    General: Skin is warm and dry.     Capillary Refill: Capillary refill takes less than 2 seconds.     Findings: Lesion present.          Comments: Dry scaly skin lesion  approximately 2 mm in diameter to left thoracic area  Neurological:     General: No focal deficit present.     Mental Status: He is alert and oriented to person, place, and time.  Psychiatric:        Mood and Affect: Mood normal.        Behavior: Behavior normal.    Lab Results  Component Value Date   WBC 7.0 08/03/2021   HGB 13.7 08/03/2021   HCT 41 08/03/2021   PLT 242 08/03/2021   GLUCOSE 98 07/25/2020   CHOL 159 07/25/2020   TRIG 128 07/25/2020   HDL 42 07/25/2020   LDLCALC 94 07/25/2020   ALT 29 08/03/2021   AST 26 08/03/2021   NA 139 08/03/2021   K 3.9 08/03/2021   CL 104 08/03/2021   CREATININE 0.7 08/03/2021   BUN 17 08/03/2021   CO2 26 (A) 08/03/2021   TSH 4.480 06/24/2019      Assessment & Plan:    1. Physical exam, annual - CBC with Differential/Platelet - Comprehensive metabolic panel - Lipid panel - Iron, TIBC and Ferritin Panel  2. Screening for colon cancer - Ambulatory referral to Gastroenterology  3. Need for vaccination - Tdap vaccine greater than or equal to 7yo IM  4. Class 3 severe obesity due to excess calories with serious comorbidity and body mass index (BMI) of 45.0 to 49.9 in adult (HCC) - CBC with Differential/Platelet - Comprehensive metabolic panel - Lipid panel - Iron, TIBC and Ferritin Panel  5. Bunion of left foot - Ambulatory referral to Podiatry  6. Skin lesion of back - CBC with  Differential/Platelet - Comprehensive metabolic panel - Ambulatory referral to Dermatology  7. Other hemochromatosis - CBC with Differential/Platelet - Comprehensive metabolic panel - Lipid panel - Ambulatory referral to Hematology / Oncology     These are the goals we discussed:  Goals   Weight loss      This is a list of the screening recommended for you and due dates:  Health Maintenance  Topic Date Due   Colon Cancer Screening  Never done   Flu Shot  06/02/2022*   Tetanus Vaccine  12/11/2022*   HPV Vaccine  Aged Out    COVID-19 Vaccine  Discontinued  *Topic was postponed. The date shown is not the original due date.    We will call you with lab results and referral for podiatry, hematology, and dermatology TDAP (tetanus, diptheria, and pertussis) vaccine given in office today Follow-up for weight management appointment  AN INDIVIDUALIZED CARE PLAN: was established or reinforced today.   SELF MANAGEMENT: The patient and I together assessed ways to personally work towards obtaining the recommended goals  Support needs The patient and/or family needs were assessed and services were offered and not necessary at this time.    Follow-up: weight management  I, Rip Harbour, NP, have reviewed all documentation for this visit. The documentation on 12/10/21 for the exam, diagnosis, procedures, and orders are all accurate and complete.   Signed, Jerrell [place], [place] [phone]

## 2021-12-10 NOTE — Patient Instructions (Addendum)
We will call you with lab results and referral for podiatry, hematology, and dermatology TDAP (tetanus, diptheria, and pertussis) vaccine given in office today Follow-up for weight management appointment  Tdap (Tetanus, Diphtheria, Pertussis) Vaccine: What You Need to Know 1. Why get vaccinated? Tdap vaccine can prevent tetanus, diphtheria, and pertussis. Diphtheria and pertussis spread from person to person. Tetanus enters the body through cuts or wounds. TETANUS (T) causes painful stiffening of the muscles. Tetanus can lead to serious health problems, including being unable to open the mouth, having trouble swallowing and breathing, or death. DIPHTHERIA (D) can lead to difficulty breathing, heart failure, paralysis, or death. PERTUSSIS (aP), also known as "whooping cough," can cause uncontrollable, violent coughing that makes it hard to breathe, eat, or drink. Pertussis can be extremely serious especially in babies and young children, causing pneumonia, convulsions, brain damage, or death. In teens and adults, it can cause weight loss, loss of bladder control, passing out, and rib fractures from severe coughing. 2. Tdap vaccine Tdap is only for children 7 years and older, adolescents, and adults.  Adolescents should receive a single dose of Tdap, preferably at age 90 or 29 years. Pregnant people should get a dose of Tdap during every pregnancy, preferably during the early part of the third trimester, to help protect the newborn from pertussis. Infants are most at risk for severe, life-threatening complications from pertussis. Adults who have never received Tdap should get a dose of Tdap. Also, adults should receive a booster dose of either Tdap or Td (a different vaccine that protects against tetanus and diphtheria but not pertussis) every 10 years, or after 5 years in the case of a severe or dirty wound or burn. Tdap may be given at the same time as other vaccines. 3. Talk with your health care  provider Tell your vaccine provider if the person getting the vaccine: Has had an allergic reaction after a previous dose of any vaccine that protects against tetanus, diphtheria, or pertussis, or has any severe, life-threatening allergies Has had a coma, decreased level of consciousness, or prolonged seizures within 7 days after a previous dose of any pertussis vaccine (DTP, DTaP, or Tdap) Has seizures or another nervous system problem Has ever had Guillain-Barr Syndrome (also called "GBS") Has had severe pain or swelling after a previous dose of any vaccine that protects against tetanus or diphtheria In some cases, your health care provider may decide to postpone Tdap vaccination until a future visit. People with minor illnesses, such as a cold, may be vaccinated. People who are moderately or severely ill should usually wait until they recover before getting Tdap vaccine.  Your health care provider can give you more information. 4. Risks of a vaccine reaction Pain, redness, or swelling where the shot was given, mild fever, headache, feeling tired, and nausea, vomiting, diarrhea, or stomachache sometimes happen after Tdap vaccination. People sometimes faint after medical procedures, including vaccination. Tell your provider if you feel dizzy or have vision changes or ringing in the ears.  As with any medicine, there is a very remote chance of a vaccine causing a severe allergic reaction, other serious injury, or death. 5. What if there is a serious problem? An allergic reaction could occur after the vaccinated person leaves the clinic. If you see signs of a severe allergic reaction (hives, swelling of the face and throat, difficulty breathing, a fast heartbeat, dizziness, or weakness), call 9-1-1 and get the person to the nearest hospital. For other signs that concern you,  call your health care provider.  Adverse reactions should be reported to the Vaccine Adverse Event Reporting System (VAERS).  Your health care provider will usually file this report, or you can do it yourself. Visit the VAERS website at www.vaers.SamedayNews.es or call 410-367-0365. VAERS is only for reporting reactions, and VAERS staff members do not give medical advice. 6. The National Vaccine Injury Compensation Program The [employer] Vaccine Injury Compensation Program (VICP) is a federal program that was created to compensate people who may have been injured by certain vaccines. Claims regarding alleged injury or death due to vaccination have a time limit for filing, which may be as short as two years. Visit the VICP website at GoldCloset.com.ee or call (630)654-6086 to learn about the program and about filing a claim. 7. How can I learn more? Ask your health care provider. Call your local or state health department. Visit the website of the Food and Drug Administration (FDA) for vaccine package inserts and additional information at TraderRating.uy. Contact the Centers for Disease Control and Prevention (CDC): Call (636)562-3305 (1-800-CDC-INFO) or Visit CDC's website at http://hunter.com/. Source: CDC Vaccine Information Statement Tdap (Tetanus, Diphtheria, Pertussis) Vaccine (10/08/2019) This same material is available at http://www.wolf.info/ for no charge. This information is not intended to replace advice given to you by your health care provider. Make sure you discuss any questions you have with your health care provider. Document Revised: 01/17/2021 Document Reviewed: 11/20/2020 Elsevier Patient Education  [place] Maintenance, Male Adopting a healthy lifestyle and getting preventive care are important in promoting health and wellness. Ask your health care provider about: The right schedule for you to have regular tests and exams. Things you can do on your own to prevent diseases and keep yourself healthy. What should I know about diet, weight, and  exercise? Eat a healthy diet  Eat a diet that includes plenty of vegetables, fruits, low-fat dairy products, and lean protein. Do not eat a lot of foods that are high in solid fats, added sugars, or sodium. Maintain a healthy weight Body mass index (BMI) is a measurement that can be used to identify possible weight problems. It estimates body fat based on height and weight. Your health care provider can help determine your BMI and help you achieve or maintain a healthy weight. Get regular exercise Get regular exercise. This is one of the most important things you can do for your health. Most adults should: Exercise for at least 150 minutes each week. The exercise should increase your heart rate and make you sweat (moderate-intensity exercise). Do strengthening exercises at least twice a week. This is in addition to the moderate-intensity exercise. Spend less time sitting. Even light physical activity can be beneficial. Watch cholesterol and blood lipids Have your blood tested for lipids and cholesterol at 48 years of age, then have this test every 5 years. You may need to have your cholesterol levels checked more often if: Your lipid or cholesterol levels are high. You are older than 48 years of age. You are at high risk for heart disease. What should I know about cancer screening? Many types of cancers can be detected early and may often be prevented. Depending on your health history and family history, you may need to have cancer screening at various ages. This may include screening for: Colorectal cancer. Prostate cancer. Skin cancer. Lung cancer. What should I know about heart disease, diabetes, and high blood pressure? Blood pressure and heart disease High blood pressure causes heart disease  and increases the risk of stroke. This is more likely to develop in people who have high blood pressure readings or are overweight. Talk with your health care provider about your target blood  pressure readings. Have your blood pressure checked: Every 3-5 years if you are 38-70 years of age. Every year if you are 20 years old or older. If you are between the ages of 31 and 44 and are a current or former smoker, ask your health care provider if you should have a one-time screening for abdominal aortic aneurysm (AAA). Diabetes Have regular diabetes screenings. This checks your fasting blood sugar level. Have the screening done: Once every three years after age 56 if you are at a normal weight and have a low risk for diabetes. More often and at a younger age if you are overweight or have a high risk for diabetes. What should I know about preventing infection? Hepatitis B If you have a higher risk for hepatitis B, you should be screened for this virus. Talk with your health care provider to find out if you are at risk for hepatitis B infection. Hepatitis C Blood testing is recommended for: Everyone born from [birth year] through 1967. Anyone with known risk factors for hepatitis C. Sexually transmitted infections (STIs) You should be screened each year for STIs, including gonorrhea and chlamydia, if: You are sexually active and are younger than 48 years of age. You are older than 48 years of age and your health care provider tells you that you are at risk for this type of infection. Your sexual activity has changed since you were last screened, and you are at increased risk for chlamydia or gonorrhea. Ask your health care provider if you are at risk. Ask your health care provider about whether you are at high risk for HIV. Your health care provider may recommend a prescription medicine to help prevent HIV infection. If you choose to take medicine to prevent HIV, you should first get tested for HIV. You should then be tested every 3 months for as long as you are taking the medicine. Follow these instructions at home: Alcohol use Do not drink alcohol if your health care provider tells you not to  drink. If you drink alcohol: Limit how much you have to 0-2 drinks a day. Know how much alcohol is in your drink. In the [place] one drink equals one 12 oz bottle of beer (355 mL), one 5 oz glass of wine (148 mL), or one 1 oz glass of hard liquor (44 mL). Lifestyle Do not use any products that contain nicotine or tobacco. These products include cigarettes, chewing tobacco, and vaping devices, such as e-cigarettes. If you need help quitting, ask your health care provider. Do not use street drugs. Do not share needles. Ask your health care provider for help if you need support or information about quitting drugs. General instructions Schedule regular health, dental, and eye exams. Stay current with your vaccines. Tell your health care provider if: You often feel depressed. You have ever been abused or do not feel safe at home. Summary Adopting a healthy lifestyle and getting preventive care are important in promoting health and wellness. Follow your health care provider's instructions about healthy diet, exercising, and getting tested or screened for diseases. Follow your health care provider's instructions on monitoring your cholesterol and blood pressure. This information is not intended to replace advice given to you by your health care provider. Make sure you discuss any questions you have with  your health care provider. Document Revised: 07/10/2020 Document Reviewed: 07/10/2020 Elsevier Patient Education  [place].

## 2021-12-11 LAB — CBC WITH DIFFERENTIAL/PLATELET
Basophils Absolute: 0.1 10*3/uL (ref 0.0–0.2)
Basos: 1 %
EOS (ABSOLUTE): 0.7 10*3/uL — ABNORMAL HIGH (ref 0.0–0.4)
Eos: 6 %
Hematocrit: 45.8 % (ref 37.5–51.0)
Hemoglobin: 15.8 g/dL (ref 13.0–17.7)
Immature Grans (Abs): 0 10*3/uL (ref 0.0–0.1)
Immature Granulocytes: 0 %
Lymphocytes Absolute: 2.5 10*3/uL (ref 0.7–3.1)
Lymphs: 23 %
MCH: 31.4 pg (ref 26.6–33.0)
MCHC: 34.5 g/dL (ref 31.5–35.7)
MCV: 91 fL (ref 79–97)
Monocytes Absolute: 0.7 10*3/uL (ref 0.1–0.9)
Monocytes: 6 %
Neutrophils Absolute: 7 10*3/uL (ref 1.4–7.0)
Neutrophils: 64 %
Platelets: 286 10*3/uL (ref 150–450)
RBC: 5.03 x10E6/uL (ref 4.14–5.80)
RDW: 12.5 % (ref 11.6–15.4)
WBC: 11 10*3/uL — ABNORMAL HIGH (ref 3.4–10.8)

## 2021-12-11 LAB — COMPREHENSIVE METABOLIC PANEL
ALT: 23 IU/L (ref 0–44)
AST: 20 IU/L (ref 0–40)
Albumin/Globulin Ratio: 1.8 (ref 1.2–2.2)
Albumin: 4.7 g/dL (ref 4.1–5.1)
Alkaline Phosphatase: 87 IU/L (ref 44–121)
BUN/Creatinine Ratio: 16 (ref 9–20)
BUN: 13 mg/dL (ref 6–24)
Bilirubin Total: 0.4 mg/dL (ref 0.0–1.2)
CO2: 25 mmol/L (ref 20–29)
Calcium: 9.4 mg/dL (ref 8.7–10.2)
Chloride: 97 mmol/L (ref 96–106)
Creatinine, Ser: 0.83 mg/dL (ref 0.76–1.27)
Globulin, Total: 2.6 g/dL (ref 1.5–4.5)
Glucose: 84 mg/dL (ref 70–99)
Potassium: 4.6 mmol/L (ref 3.5–5.2)
Sodium: 137 mmol/L (ref 134–144)
Total Protein: 7.3 g/dL (ref 6.0–8.5)
eGFR: 108 mL/min/{1.73_m2} (ref >59)

## 2021-12-11 LAB — LIPID PANEL
Chol/HDL Ratio: 4.1 ratio (ref 0.0–5.0)
Cholesterol, Total: 174 mg/dL (ref 100–199)
HDL: 42 mg/dL (ref >39)
LDL Chol Calc (NIH): 102 mg/dL — ABNORMAL HIGH (ref 0–99)
Triglycerides: 175 mg/dL — ABNORMAL HIGH (ref 0–149)
VLDL Cholesterol Cal: 30 mg/dL (ref 5–40)

## 2021-12-11 LAB — IRON,TIBC AND FERRITIN PANEL
Ferritin: 361 ng/mL (ref 30–400)
Iron Saturation: 81 % (ref 15–55)
Iron: 193 ug/dL — ABNORMAL HIGH (ref 38–169)
Total Iron Binding Capacity: 239 ug/dL — ABNORMAL LOW (ref 250–450)
UIBC: 46 ug/dL — ABNORMAL LOW (ref 111–343)

## 2021-12-11 LAB — CARDIOVASCULAR RISK ASSESSMENT

## 2021-12-23 ENCOUNTER — Other Ambulatory Visit: Payer: Self-pay | Admitting: Legal Medicine

## 2021-12-23 DIAGNOSIS — I10 Essential (primary) hypertension: Secondary | ICD-10-CM

## 2021-12-28 ENCOUNTER — Ambulatory Visit (INDEPENDENT_AMBULATORY_CARE_PROVIDER_SITE_OTHER): Payer: No Typology Code available for payment source | Admitting: Podiatry

## 2021-12-28 ENCOUNTER — Inpatient Hospital Stay (HOSPITAL_BASED_OUTPATIENT_CLINIC_OR_DEPARTMENT_OTHER): Payer: No Typology Code available for payment source | Admitting: Family

## 2021-12-28 ENCOUNTER — Inpatient Hospital Stay: Payer: No Typology Code available for payment source | Attending: Hematology & Oncology

## 2021-12-28 ENCOUNTER — Inpatient Hospital Stay: Payer: No Typology Code available for payment source

## 2021-12-28 ENCOUNTER — Other Ambulatory Visit: Payer: Self-pay | Admitting: Family

## 2021-12-28 ENCOUNTER — Encounter: Payer: Self-pay | Admitting: Family

## 2021-12-28 ENCOUNTER — Ambulatory Visit (INDEPENDENT_AMBULATORY_CARE_PROVIDER_SITE_OTHER): Payer: No Typology Code available for payment source

## 2021-12-28 ENCOUNTER — Telehealth: Payer: Self-pay | Admitting: *Deleted

## 2021-12-28 DIAGNOSIS — Z825 Family history of asthma and other chronic lower respiratory diseases: Secondary | ICD-10-CM

## 2021-12-28 DIAGNOSIS — Z823 Family history of stroke: Secondary | ICD-10-CM

## 2021-12-28 DIAGNOSIS — M21619 Bunion of unspecified foot: Secondary | ICD-10-CM | POA: Diagnosis not present

## 2021-12-28 DIAGNOSIS — Z79899 Other long term (current) drug therapy: Secondary | ICD-10-CM | POA: Diagnosis not present

## 2021-12-28 DIAGNOSIS — Z8616 Personal history of COVID-19: Secondary | ICD-10-CM

## 2021-12-28 DIAGNOSIS — Z8043 Family history of malignant neoplasm of testis: Secondary | ICD-10-CM | POA: Insufficient documentation

## 2021-12-28 DIAGNOSIS — Z8249 Family history of ischemic heart disease and other diseases of the circulatory system: Secondary | ICD-10-CM | POA: Diagnosis not present

## 2021-12-28 DIAGNOSIS — B351 Tinea unguium: Secondary | ICD-10-CM | POA: Diagnosis not present

## 2021-12-28 DIAGNOSIS — R42 Dizziness and giddiness: Secondary | ICD-10-CM | POA: Insufficient documentation

## 2021-12-28 DIAGNOSIS — M21612 Bunion of left foot: Secondary | ICD-10-CM

## 2021-12-28 DIAGNOSIS — Z8349 Family history of other endocrine, nutritional and metabolic diseases: Secondary | ICD-10-CM | POA: Diagnosis not present

## 2021-12-28 LAB — CBC WITH DIFFERENTIAL (CANCER CENTER ONLY)
Abs Immature Granulocytes: 0.03 10*3/uL (ref 0.00–0.07)
Basophils Absolute: 0 10*3/uL (ref 0.0–0.1)
Basophils Relative: 0 %
Eosinophils Absolute: 0.5 10*3/uL (ref 0.0–0.5)
Eosinophils Relative: 4 %
HCT: 42.7 % (ref 39.0–52.0)
Hemoglobin: 14.4 g/dL (ref 13.0–17.0)
Immature Granulocytes: 0 %
Lymphocytes Relative: 26 %
Lymphs Abs: 2.9 10*3/uL (ref 0.7–4.0)
MCH: 31.4 pg (ref 26.0–34.0)
MCHC: 33.7 g/dL (ref 30.0–36.0)
MCV: 93.2 fL (ref 80.0–100.0)
Monocytes Absolute: 0.9 10*3/uL (ref 0.1–1.0)
Monocytes Relative: 8 %
Neutro Abs: 6.8 10*3/uL (ref 1.7–7.7)
Neutrophils Relative %: 62 %
Platelet Count: 276 10*3/uL (ref 150–400)
RBC: 4.58 MIL/uL (ref 4.22–5.81)
RDW: 12.4 % (ref 11.5–15.5)
WBC Count: 11.2 10*3/uL — ABNORMAL HIGH (ref 4.0–10.5)
nRBC: 0 % (ref 0.0–0.2)

## 2021-12-28 LAB — CMP (CANCER CENTER ONLY)
ALT: 26 U/L (ref 0–44)
AST: 27 U/L (ref 15–41)
Albumin: 3.9 g/dL (ref 3.5–5.0)
Alkaline Phosphatase: 70 U/L (ref 38–126)
Anion gap: 5 (ref 5–15)
BUN: 16 mg/dL (ref 6–20)
CO2: 27 mmol/L (ref 22–32)
Calcium: 8.5 mg/dL — ABNORMAL LOW (ref 8.9–10.3)
Chloride: 106 mmol/L (ref 98–111)
Creatinine: 0.99 mg/dL (ref 0.61–1.24)
GFR, Estimated: >60 mL/min (ref >60)
Glucose, Bld: 92 mg/dL (ref 70–99)
Potassium: 3.9 mmol/L (ref 3.5–5.1)
Sodium: 138 mmol/L (ref 135–145)
Total Bilirubin: 0.5 mg/dL (ref 0.3–1.2)
Total Protein: 7 g/dL (ref 6.5–8.1)

## 2021-12-28 LAB — FERRITIN: Ferritin: 151 ng/mL (ref 24–336)

## 2021-12-28 LAB — LACTATE DEHYDROGENASE: LDH: 169 U/L (ref 98–192)

## 2021-12-28 MED ORDER — TERBINAFINE HCL 250 MG PO TABS: 250.0000 mg | ORAL_TABLET | Freq: Every day | ORAL | 2 refills | Status: AC

## 2021-12-28 MED ORDER — CICLOPIROX 8 % EX SOLN: Freq: Every day | CUTANEOUS | 0 refills | Status: DC

## 2021-12-28 NOTE — Patient Instructions (Signed)

## 2021-12-28 NOTE — Progress Notes (Signed)
Subjective:  Patient ID: Austin Gilbert, male    DOB: 01-27-1976,  MRN: [id number]  Chief Complaint  Patient presents with   Bunions    Bunion of left foot   Nail Problem    Nail fungus to left hallux. Has not tried any medication for the nail fungus.    Rash    Dark rash to bilateral top of feet. Has been there for a long time. Does not itch.     46 y.o. male presents for concern for left foot pain related to bunion deformity.  Also has thickening discoloration and abnormal growth of the left hallux nail.  Also has noticed a copper colored rash present at the distal aspect of both feet.  He does report a history of swelling as well as high iron levels.  He states the bunion on his left foot hurts primarily when he is working.  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  He has not tried much yet for this problem.  Past Medical History:  Diagnosis Date   COVID-19 03/2020   Epilepsy ([place])    Hemochromatosis    Other hemochromatosis     No Known Allergies  ROS: Negative except as per HPI above  Objective:  General: AAO x3, NAD  Dermatological: Nails with thickening discoloration dystrophic growth likely secondary to fungal infection worst at left hallux nail.  Copper colored spotty discoloration at the dorsal forefoot bilaterally.  Vascular:  Dorsalis Pedis artery and Posterior Tibial artery pedal pulses are 2/4 bilateral.  Capillary fill time < 3 sec to all digits.   Neruologic: Grossly intact via light touch bilateral. Protective threshold intact to all sites bilateral.   Musculoskeletal: Attention directed to the left forefoot there is no to be hallux abductovalgus deformity present.  Mild to moderate in nature.  Increased prominence of the first metatarsal head medially.  Pain palpation of this area.  Gait: Unassisted, Nonantalgic.   No images are attached to the encounter.  Radiographs:  Date: 12/28/2021 XR left foot weightbearing  AP/Lateral/Oblique   Findings: hallux valgus deformity Metatarsal parabola normal. 1st/2nd IMA: increased about 14-16 degrees; TSP: 5.  No significant arthritic changes noted at the first metatarsophalangeal joint. Assessment:   1. Bunion   2. Onychomycosis      Plan:  Patient was evaluated and treated and all questions answered.  #Hallux abductovalgus deformity left foot -Discussed conservative or surgical intervention for left foot bunion deformity with mild pain. -Conservatively I recommend wider toe box shoes gel cushioning anti-inflammatories as needed for pain -Explained Lapidus versus minimally invasive bunionectomy procedure and length of recovery needed in addition to 2 to 4 weeks of nonweightbearing needed for the procedure. -Patient is unable to proceed with hallux valgus correction surgical intervention at this time.  He cannot take time off work to be nonweightbearing.  Recommend gel spacer over the medial eminence to decrease his pain.  #Onychomycosis left hallux nail Onychomycosis -Educated on etiology of nail fungus. -Nail sample taken for microbiology and histology. -Baseline liver function studies to be completed later today at primary care doctor, they will fax records over to [place] -eRx for oral terbinafine #30. Educated on risks and benefits of the medication. - eRx for Penlac 8% solution applied daily for 3 months.    Return in about 3 months (around 03/30/2022) for follow up Left hallux onychomycosis.          Everitt Amber, DPM [facility] / [facility]

## 2021-12-28 NOTE — Telephone Encounter (Signed)
Wife of patient is calling to ask if the CBC labs visit on 12/10/21  from PCP (is in my chart to view)is acceptable blood work needed for the Lamisil prescription, please advise

## 2021-12-28 NOTE — Progress Notes (Unsigned)
Hematology/Oncology Consultation   Name: Austin Gilbert      MRN: [id number]    Location: Room/bed info not found  Date: 12/28/2021 Time:2:20 PM   REFERRING PHYSICIAN: Jerrell Belfast, NP  REASON FOR CONSULT: Other hemochromatosis    DIAGNOSIS: Hemochromatosis DNA and iron studies pending  HISTORY OF PRESENT ILLNESS: Austin Gilbert is a very pleasant 46 yo caucasian gentleman with long history of hemochromatosis originally diagnosed several years ago while he was living in [place]. 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  He has a port in place for phlebotomy. His last phlebotomy was in March 2023. He states that he has had syncope with phlebotomies in the past so we will make sure to give him replacement fluids.  Earlier this month iron saturation was 81% and ferritin 361.  His family has not been tested for the hemochromatosis DNA.  He has occasional dizziness.  No personal history of cancer.  His brother had testicular cancer.  He has an appointment coming up with dermatology for a scaly spot on his back.  No history of diabetes or thyroid disease.   He had his wisdom teeth removed and a tonsillectomy in the past without any complications.  No fever, chills, n/v, cough, rash, dizziness, SOB, chest pain, palpitations, abdominal pain or changes in bowel or bladder habits.  No tenderness, numbness or tingling in her extremities.  He notes intermittent puffiness in his feet and ankles.  No falls or syncope.  No smoking, ETOH recreational drug use.  Appetite and hydration have been good. Weight is stable at 295 lbs.   He works in Mining engineer.   ROS: All other 10 point review of systems is negative.   PAST MEDICAL HISTORY:   Past Medical History:  Diagnosis Date   COVID-19 03/2020   Epilepsy ([place])    Hemochromatosis    Other hemochromatosis     ALLERGIES: No Known Allergies    MEDICATIONS:  Current Outpatient Medications on File Prior to  Visit  Medication Sig Dispense Refill   amLODipine (NORVASC) 5 MG tablet TAKE 1 TABLET (5 MG TOTAL) BY MOUTH DAILY. 90 tablet 0   Multiple Vitamin (MULTIVITAMIN) tablet Take 1 tablet by mouth daily.     Current Facility-Administered Medications on File Prior to Visit  Medication Dose Route Frequency Provider Last Rate Last Admin   alteplase (CATHFLO ACTIVASE) injection 2 mg  2 mg Intracatheter Once Lewis, Dequincy A, MD       sodium chloride flush (NS) 0.9 % injection 10 mL  10 mL Intracatheter PRN Lewis, Dequincy A, MD   10 mL at 08/03/21 3710     PAST SURGICAL HISTORY No past surgical history on file.  FAMILY HISTORY: Family History  Problem Relation Age of Onset   Heart disease Mother    Hypertension Mother    Hyperlipidemia Mother    Transient ischemic attack Mother    COPD Father    Testicular cancer Brother     SOCIAL HISTORY:  reports that he has never smoked. He has never used smokeless tobacco. He reports that he does not currently use alcohol. He reports that he does not currently use drugs after having used the following drugs: Marijuana.  PERFORMANCE STATUS: The patient's performance status is 1 - Symptomatic but completely ambulatory  PHYSICAL EXAM: Most Recent Vital Signs: Blood pressure 134/65, pulse 80, temperature (!) 97 F (36.1 C), temperature source Oral, resp. rate 17, height '5\' 7"'$  (1.702 m), weight  295 lb (133.8 kg), SpO2 100 %. BP 134/65 (BP Location: Right Arm, Patient Position: Sitting)   Pulse 80   Temp (!) 97 F (36.1 C) (Oral)   Resp 17   Ht '5\' 7"'$  (1.702 m)   Wt 295 lb (133.8 kg)   SpO2 100%   BMI 46.20 kg/m   General Appearance:    Alert, cooperative, no distress, appears stated age  Head:    Normocephalic, without obvious abnormality, atraumatic  Eyes:    PERRL, conjunctiva/corneas clear, EOM's intact, fundi    benign, both eyes        Throat:   Lips, mucosa, and tongue normal; teeth and gums normal  Neck:   Supple, symmetrical,  trachea midline, no adenopathy;    thyroid:  no enlargement/tenderness/nodules; no carotid   bruit or JVD  Back:     Symmetric, no curvature, ROM normal, no CVA tenderness  Lungs:     Clear to auscultation bilaterally, respirations unlabored  Chest Wall:    No tenderness or deformity   Heart:    Regular rate and rhythm, S1 and S2 normal, no murmur, rub   or gallop     Abdomen:     Soft, non-tender, bowel sounds active all four quadrants,    no masses, no organomegaly        Extremities:   Extremities normal, atraumatic, no cyanosis or edema  Pulses:   2+ and symmetric all extremities  Skin:   Skin color, texture, turgor normal, no rashes or lesions  Lymph nodes:   Cervical, supraclavicular, and axillary nodes normal  Neurologic:   CNII-XII intact, normal strength, sensation and reflexes    throughout    LABORATORY DATA:  No results found for this or any previous visit (from the past 48 hour(s)).    RADIOGRAPHY: No results found.     PATHOLOGY: None  ASSESSMENT/PLAN: Austin Gilbert is a very pleasant 46 yo caucasian gentleman with long history of hemochromatosis.  Iron studies pending. We will get him set up for phlebotomy and replacement fluids if needed.  Lab check in 6 weeks.  Follow-up in 3 months.   All questions were answered. The patient knows to call the clinic with any problems, questions or concerns. We can certainly see the patient much sooner if necessary.   Lottie Dawson, NP

## 2021-12-28 NOTE — Progress Notes (Signed)
Pt had port placed at [facility] 06/11/2019, okay to use for lab work today per Gillian Shields. Pt to get xray of port placement for next visit.

## 2021-12-31 LAB — IRON AND IRON BINDING CAPACITY (CC-WL,HP ONLY)
Iron: 184 ug/dL — ABNORMAL HIGH (ref 45–182)
Saturation Ratios: 80 % — ABNORMAL HIGH (ref 17.9–39.5)
TIBC: 231 ug/dL — ABNORMAL LOW (ref 250–450)
UIBC: 47 ug/dL — ABNORMAL LOW (ref 117–376)

## 2021-12-31 NOTE — Telephone Encounter (Signed)
Called patient giving results, and that he can start taking lamicil,verbalized understanding.

## 2022-01-02 ENCOUNTER — Encounter: Payer: Self-pay | Admitting: Family

## 2022-01-04 ENCOUNTER — Other Ambulatory Visit: Payer: Self-pay | Admitting: Family

## 2022-01-08 LAB — HEMOCHROMATOSIS DNA-PCR(C282Y,H63D)

## 2022-01-10 ENCOUNTER — Ambulatory Visit: Payer: No Typology Code available for payment source | Admitting: Nurse Practitioner

## 2022-01-11 ENCOUNTER — Inpatient Hospital Stay: Payer: No Typology Code available for payment source | Attending: Hematology & Oncology

## 2022-01-11 DIAGNOSIS — Z79899 Other long term (current) drug therapy: Secondary | ICD-10-CM | POA: Diagnosis not present

## 2022-01-11 DIAGNOSIS — Z825 Family history of asthma and other chronic lower respiratory diseases: Secondary | ICD-10-CM | POA: Insufficient documentation

## 2022-01-11 DIAGNOSIS — Z8349 Family history of other endocrine, nutritional and metabolic diseases: Secondary | ICD-10-CM | POA: Insufficient documentation

## 2022-01-11 DIAGNOSIS — Z8043 Family history of malignant neoplasm of testis: Secondary | ICD-10-CM | POA: Diagnosis not present

## 2022-01-11 DIAGNOSIS — Z8249 Family history of ischemic heart disease and other diseases of the circulatory system: Secondary | ICD-10-CM | POA: Diagnosis not present

## 2022-01-11 MED ORDER — SODIUM CHLORIDE 0.9% FLUSH
10.0000 mL | INTRAVENOUS | Status: DC | PRN
  Administered 2022-01-11: 10 mL via INTRAVENOUS

## 2022-01-11 MED ORDER — HEPARIN SOD (PORK) LOCK FLUSH 100 UNIT/ML IV SOLN
500.0000 [IU] | Freq: Once | INTRAVENOUS | Status: AC
  Administered 2022-01-11: 500 [IU] via INTRAVENOUS

## 2022-01-11 NOTE — Patient Instructions (Signed)

## 2022-01-11 NOTE — Progress Notes (Signed)
Austin Gilbert presents today for phlebotomy per MD orders. Phlebotomy procedure started at 0830 and ended at 0924. 500 cc removed. 1000 ml IVF given for replacement. Patient tolerated procedure well. 0950 BP stable IV needle removed intact.

## 2022-01-18 ENCOUNTER — Inpatient Hospital Stay: Payer: No Typology Code available for payment source

## 2022-01-21 ENCOUNTER — Inpatient Hospital Stay: Payer: No Typology Code available for payment source

## 2022-01-21 NOTE — Progress Notes (Signed)
Austin Gilbert presents today for phlebotomy per MD orders. Phlebotomy procedure started at 0835 and ended at 0905. 500 grams removed from rt PAC.  Patient tolerated procedure well. IV needle removed intact. Pt declined 30 minute post observation. dph

## 2022-02-01 ENCOUNTER — Ambulatory Visit (AMBULATORY_SURGERY_CENTER): Payer: No Typology Code available for payment source

## 2022-02-01 ENCOUNTER — Inpatient Hospital Stay: Payer: No Typology Code available for payment source | Attending: Hematology & Oncology

## 2022-02-01 VITALS — Ht 67.0 in | Wt 292.0 lb

## 2022-02-01 DIAGNOSIS — Z79899 Other long term (current) drug therapy: Secondary | ICD-10-CM | POA: Diagnosis not present

## 2022-02-01 DIAGNOSIS — Z1211 Encounter for screening for malignant neoplasm of colon: Secondary | ICD-10-CM

## 2022-02-01 MED ORDER — SODIUM CHLORIDE 0.9 % IV SOLN: Freq: Once | INTRAVENOUS | Status: AC

## 2022-02-01 MED ORDER — ALTEPLASE 2 MG IJ SOLR
2.0000 mg | Freq: Once | INTRAMUSCULAR | Status: AC
  Administered 2022-02-01: 2 mg

## 2022-02-01 MED ORDER — SODIUM CHLORIDE 0.9% FLUSH
10.0000 mL | INTRAVENOUS | Status: DC | PRN
  Administered 2022-02-01: 10 mL via INTRAVENOUS

## 2022-02-01 MED ORDER — NA SULFATE-K SULFATE-MG SULF 17.5-3.13-1.6 GM/177ML PO SOLN: 1.0000 | Freq: Once | ORAL | 0 refills | Status: AC

## 2022-02-01 MED ORDER — HEPARIN SOD (PORK) LOCK FLUSH 100 UNIT/ML IV SOLN
500.0000 [IU] | Freq: Once | INTRAVENOUS | Status: AC
  Administered 2022-02-01: 500 [IU] via INTRAVENOUS

## 2022-02-01 NOTE — Progress Notes (Signed)

## 2022-02-01 NOTE — Patient Instructions (Signed)

## 2022-02-01 NOTE — Progress Notes (Signed)
Austin Gilbert placed at 10:52. No blood return at 11:12. Blood return at 11:32.  Austin Gilbert presents today for phlebotomy per MD orders. Phlebotomy procedure started at 11:33 and ended at 12:00. 500 cc removed via port-a-cath.Fluid replacement given per orders. Patient tolerated procedure well.

## 2022-02-06 ENCOUNTER — Encounter: Payer: Self-pay | Admitting: Gastroenterology

## 2022-02-11 ENCOUNTER — Encounter: Payer: Self-pay | Admitting: Gastroenterology

## 2022-02-11 ENCOUNTER — Ambulatory Visit (AMBULATORY_SURGERY_CENTER): Payer: No Typology Code available for payment source | Admitting: Gastroenterology

## 2022-02-11 VITALS — BP 132/68 | HR 76 | Temp 98.6°F | Resp 18 | Ht 67.0 in | Wt 292.0 lb

## 2022-02-11 DIAGNOSIS — D120 Benign neoplasm of cecum: Secondary | ICD-10-CM | POA: Diagnosis not present

## 2022-02-11 DIAGNOSIS — K635 Polyp of colon: Secondary | ICD-10-CM | POA: Diagnosis not present

## 2022-02-11 DIAGNOSIS — D125 Benign neoplasm of sigmoid colon: Secondary | ICD-10-CM | POA: Diagnosis not present

## 2022-02-11 DIAGNOSIS — Z1211 Encounter for screening for malignant neoplasm of colon: Secondary | ICD-10-CM

## 2022-02-11 DIAGNOSIS — D122 Benign neoplasm of ascending colon: Secondary | ICD-10-CM

## 2022-02-11 MED ORDER — SODIUM CHLORIDE 0.9 % IV SOLN: 500.0000 mL | INTRAVENOUS | Status: DC

## 2022-02-11 NOTE — Patient Instructions (Signed)
Impression/Recommendations:  Polyp and hemorrhoid handouts given to patient.  Resume previous diet. Continue present medications. Await pathology results.  Repeat colonoscopy in 3 years for surveillance based on pathology results, with 2 day prep.  YOU HAD AN ENDOSCOPIC PROCEDURE TODAY AT [facility]:   Refer to the procedure report that was given to you for any specific questions about what was found during the examination.  If the procedure report does not answer your questions, please call your gastroenterologist to clarify.  If you requested that your care partner not be given the details of your procedure findings, then the procedure report has been included in a sealed envelope for you to review at your convenience later.  YOU SHOULD EXPECT: Some feelings of bloating in the abdomen. Passage of more gas than usual.  Walking can help get rid of the air that was put into your GI tract during the procedure and reduce the bloating. If you had a lower endoscopy (such as a colonoscopy or flexible sigmoidoscopy) you may notice spotting of blood in your stool or on the toilet paper. If you underwent a bowel prep for your procedure, you may not have a normal bowel movement for a few days.  Please Note:  You might notice some irritation and congestion in your nose or some drainage.  This is from the oxygen used during your procedure.  There is no need for concern and it should clear up in a day or so.  SYMPTOMS TO REPORT IMMEDIATELY:  Following lower endoscopy (colonoscopy or flexible sigmoidoscopy):  Excessive amounts of blood in the stool  Significant tenderness or worsening of abdominal pains  Swelling of the abdomen that is new, acute  Fever of 100F or higher For urgent or emergent issues, a gastroenterologist can be reached at any hour by calling [phone]. Do not use MyChart messaging for urgent concerns.    DIET:  We do recommend a small meal at first, but then you  may proceed to your regular diet.  Drink plenty of fluids but you should avoid alcoholic beverages for 24 hours.  ACTIVITY:  You should plan to take it easy for the rest of today and you should NOT DRIVE or use heavy machinery until tomorrow (because of the sedation medicines used during the test).    FOLLOW UP: Our staff will call the number listed on your records the next business day following your procedure.  We will call around 7:15- 8:00 am to check on you and address any questions or concerns that you may have regarding the information given to you following your procedure. If we do not reach you, we will leave a message.     If any biopsies were taken you will be contacted by phone or by letter within the next 1-3 weeks.  Please call us at [phone] if you have not heard about the biopsies in 3 weeks.    SIGNATURES/CONFIDENTIALITY: You and/or your care partner have signed paperwork which will be entered into your electronic medical record.  These signatures attest to the fact that that the information above on your After Visit Summary has been reviewed and is understood.  Full responsibility of the confidentiality of this discharge information lies with you and/or your care-partner.

## 2022-02-11 NOTE — Progress Notes (Signed)
Vital signs checked by:DT  The patient states no changes in medical or surgical history since pre-visit screening on 02/01/22.

## 2022-02-11 NOTE — Progress Notes (Signed)
[facility] History and Physical   Primary Care Physician:  Rip Harbour, NP   Reason for Procedure:   CRC screening  Plan:    colon     HPI: Austin Gilbert is a 46 y.o. male    Past Medical History:  Diagnosis Date   COVID-19 03/2020   Epilepsy [facility])    Hemochromatosis    Hypertension    Other hemochromatosis    Sleep apnea     Past Surgical History:  Procedure Laterality Date   PORT A CATH INJECTION ([place] [facility]) Right 07/01/2019   TONSILLECTOMY      Prior to Admission medications   Medication Sig Start Date End Date Taking? Authorizing Provider  amLODipine (NORVASC) 5 MG tablet TAKE 1 TABLET (5 MG TOTAL) BY MOUTH DAILY. 12/23/21 03/23/22 Yes Rip Harbour, NP  ciclopirox (PENLAC) 8 % solution Apply topically at bedtime. Apply over nail and surrounding skin. Apply daily over previous coat. After seven (7) days, may remove with alcohol and continue cycle. 12/28/21  Yes Standiford, Nena Alexander, DPM  Multiple Vitamin (MULTIVITAMIN) tablet Take 1 tablet by mouth daily.   Yes [provider]  terbinafine (LAMISIL) 250 MG tablet Take 1 tablet (250 mg total) by mouth daily. 12/28/21 03/28/22 Yes Standiford, Nena Alexander, DPM    Current Outpatient Medications  Medication Sig Dispense Refill   amLODipine (NORVASC) 5 MG tablet TAKE 1 TABLET (5 MG TOTAL) BY MOUTH DAILY. 90 tablet 0   ciclopirox (PENLAC) 8 % solution Apply topically at bedtime. Apply over nail and surrounding skin. Apply daily over previous coat. After seven (7) days, may remove with alcohol and continue cycle. 6.6 mL 0   Multiple Vitamin (MULTIVITAMIN) tablet Take 1 tablet by mouth daily.     terbinafine (LAMISIL) 250 MG tablet Take 1 tablet (250 mg total) by mouth daily. 30 tablet 2   Current Facility-Administered Medications  Medication Dose Route Frequency Provider Last Rate Last Admin   0.9 %  sodium chloride infusion  500 mL Intravenous Continuous Jackquline Denmark, MD        Facility-Administered Medications Ordered in Other Visits  Medication Dose Route Frequency Provider Last Rate Last Admin   alteplase (CATHFLO ACTIVASE) injection 2 mg  2 mg Intracatheter Once Marice Potter, MD        Allergies as of 02/11/2022   (No Known Allergies)    Family History  Problem Relation Age of Onset   Heart disease Mother    Hypertension Mother    Hyperlipidemia Mother    Transient ischemic attack Mother    COPD Father    Testicular cancer Brother    Colon cancer Neg Hx    Colon polyps Neg Hx    Esophageal cancer Neg Hx    Rectal cancer Neg Hx    Stomach cancer Neg Hx     Social History   Socioeconomic History   Marital status: Married    Spouse name: Not on file   Number of children: Not on file   Years of education: Not on file   Highest education level: Not on file  Occupational History    Employer: [employer]  Tobacco Use   Smoking status: Never   Smokeless tobacco: Never  Vaping Use   Vaping Use: Never used  Substance and Sexual Activity   Alcohol use: Not Currently   Drug use: Not Currently    Types: Marijuana    Comment: Experimented with   Sexual activity: Yes  Partners: Female  Other Topics Concern   Not on file  Social History Narrative   Not on file   Social Determinants of Health   Financial Resource Strain: Low Risk  (12/10/2021)   Overall Financial Resource Strain (CARDIA)    Difficulty of Paying Living Expenses: Not hard at all  Food Insecurity: No Food Insecurity (12/10/2021)   Hunger Vital Sign    Worried About Running Out of Food in the Last Year: Never true    Ran Out of Food in the Last Year: Never true  Transportation Needs: No Transportation Needs (12/10/2021)   PRAPARE - Hydrologist (Medical): No    Lack of Transportation (Non-Medical): No  Physical Activity: Inactive (12/10/2021)   Exercise Vital Sign    Days of Exercise per Week: 0 days    Minutes of Exercise per  Session: 0 min  Stress: No Stress Concern Present (12/10/2021)   [place]    Feeling of Stress : Not at all  Social Connections: Moderately Integrated (12/10/2021)   Social Connection and Isolation Panel [NHANES]    Frequency of Communication with Friends and Family: More than three times a week    Frequency of Social Gatherings with Friends and Family: More than three times a week    Attends Religious Services: More than 4 times per year    Active Member of [employer] or Organizations: No    Attends Archivist Meetings: Never    Marital Status: Married  Human resources officer Violence: Not At Risk (12/10/2021)   Humiliation, Afraid, Rape, and Kick questionnaire    Fear of Current or Ex-Partner: No    Emotionally Abused: No    Physically Abused: No    Sexually Abused: No    Review of Systems: Positive for none All other review of systems negative except as mentioned in the HPI.  Physical Exam: Vital signs in last 24 hours: '@VSRANGES'$ @   General:   Alert,  Well-developed, well-nourished, pleasant and cooperative in NAD Lungs:  Clear throughout to auscultation.   Heart:  Regular rate and rhythm; no murmurs, clicks, rubs,  or gallops. Abdomen:  Soft, nontender and nondistended. Normal bowel sounds.   Neuro/Psych:  Alert and cooperative. Normal mood and affect. A and O x 3    No significant changes were identified.  The patient continues to be an appropriate candidate for the planned procedure and anesthesia.   Carmell Austria, MD. [facility] 02/11/2022 10:12 AM@

## 2022-02-11 NOTE — Progress Notes (Signed)
Sedate, gd SR, tolerated procedure well, VSS, report to RN 

## 2022-02-11 NOTE — Progress Notes (Signed)
Called to room to assist during endoscopic procedure.  Patient ID and intended procedure confirmed with present staff. Received instructions for my participation in the procedure from the performing physician.  

## 2022-02-11 NOTE — Op Note (Signed)
[facility] Patient Name: Austin Gilbert Procedure Date: 02/11/2022 10:29 AM MRN: [id number] Endoscopist: Jackquline Denmark , MD, [id number] Age: 46 Referring MD:  Date of Birth: 1975-04-06 Gender: Male Account #: [id number] Procedure:                Colonoscopy Indications:              Screening for colorectal malignant neoplasm Medicines:                Monitored Anesthesia Care Procedure:                Pre-Anesthesia Assessment:                           - Prior to the procedure, a History and Physical                            was performed, and patient medications and                            allergies were reviewed. The patient's tolerance of                            previous anesthesia was also reviewed. The risks                            and benefits of the procedure and the sedation                            options and risks were discussed with the patient.                            All questions were answered, and informed consent                            was obtained. Prior Anticoagulants: The patient has                            taken no anticoagulant or antiplatelet agents. ASA                            Grade Assessment: II - A patient with mild systemic                            disease. After reviewing the risks and benefits,                            the patient was deemed in satisfactory condition to                            undergo the procedure.                           After obtaining informed consent, the colonoscope  was passed under direct vision. Throughout the                            procedure, the patient's blood pressure, pulse, and                            oxygen saturations were monitored continuously. The                            Olympus CF-HQ190L [id number]) Colonoscope was                            introduced through the anus and advanced to the the                            cecum, identified  by appendiceal orifice and                            ileocecal valve. The colonoscopy was performed                            without difficulty. The patient tolerated the                            procedure well. The quality of the bowel                            preparation was good except in the right colon                            where there was adherent stool especially in the                            cecum which could not be fully washed. There was                            some adherent stool as well in the sigmoid.                            Nevertheless, aggressive suctioning and aspiration                            was performed. Overall over 90 to 95% of the                            colonic mucosa was visualized satisfactorily.. The                            ileocecal valve, appendiceal orifice, and rectum                            were photographed. Scope In: 10:31:56 AM Scope Out: 10:49:16 AM Scope Withdrawal Time: 0 hours 12 minutes 30 seconds  Total Procedure  Duration: 0 hours 17 minutes 20 seconds  Findings:                 Three sessile polyps were found in the mid sigmoid                            colon, proximal ascending colon (60m) and cecum.                            The polyps were 5 to 10 mm in size. These polyps                            were removed with a cold snare. Resection and                            retrieval were complete.                           Non-bleeding internal hemorrhoids were found during                            retroflexion. The hemorrhoids were moderate and                            Grade I (internal hemorrhoids that do not prolapse).                           The exam was otherwise without abnormality on                            direct and retroflexion views. Complications:            No immediate complications. Estimated Blood Loss:     Estimated blood loss: none. Impression:               - Three 5 to 10 mm  polyps in the mid sigmoid colon,                            in the proximal ascending colon and in the cecum,                            removed with a cold snare. Resected and retrieved.                           - Non-bleeding internal hemorrhoids.                           - The examination was otherwise normal on direct                            and retroflexion views. Recommendation:           - Patient has a contact number available for  emergencies. The signs and symptoms of potential                            delayed complications were discussed with the                            patient. Return to normal activities tomorrow.                            Written discharge instructions were provided to the                            patient.                           - Resume previous diet.                           - Continue present medications.                           - Await pathology results.                           - Repeat colonoscopy in 3 years for surveillance                            based on pathology results with 2 day prep.                           - The findings and recommendations were discussed                            with the patient's family. Jackquline Denmark, MD 02/11/2022 10:54:56 AM This report has been signed electronically.

## 2022-02-12 ENCOUNTER — Telehealth: Payer: Self-pay

## 2022-02-12 NOTE — Telephone Encounter (Signed)
  Follow up Call-     02/11/2022    9:16 AM  Call back number  Post procedure Call Back phone  # [phone]  Permission to leave phone message Yes     Follow up call made.  NALM

## 2022-02-22 ENCOUNTER — Inpatient Hospital Stay: Payer: No Typology Code available for payment source

## 2022-02-22 ENCOUNTER — Other Ambulatory Visit: Payer: No Typology Code available for payment source

## 2022-02-22 LAB — CBC WITH DIFFERENTIAL (CANCER CENTER ONLY)
Abs Immature Granulocytes: 0.02 10*3/uL (ref 0.00–0.07)
Basophils Absolute: 0 10*3/uL (ref 0.0–0.1)
Basophils Relative: 0 %
Eosinophils Absolute: 0.3 10*3/uL (ref 0.0–0.5)
Eosinophils Relative: 3 %
HCT: 41.3 % (ref 39.0–52.0)
Hemoglobin: 13.8 g/dL (ref 13.0–17.0)
Immature Granulocytes: 0 %
Lymphocytes Relative: 20 %
Lymphs Abs: 2 10*3/uL (ref 0.7–4.0)
MCH: 31.6 pg (ref 26.0–34.0)
MCHC: 33.4 g/dL (ref 30.0–36.0)
MCV: 94.5 fL (ref 80.0–100.0)
Monocytes Absolute: 0.8 10*3/uL (ref 0.1–1.0)
Monocytes Relative: 8 %
Neutro Abs: 6.9 10*3/uL (ref 1.7–7.7)
Neutrophils Relative %: 69 %
Platelet Count: 307 10*3/uL (ref 150–400)
RBC: 4.37 MIL/uL (ref 4.22–5.81)
RDW: 12.4 % (ref 11.5–15.5)
WBC Count: 10.1 10*3/uL (ref 4.0–10.5)
nRBC: 0 % (ref 0.0–0.2)

## 2022-02-22 LAB — CMP (CANCER CENTER ONLY)
ALT: 21 U/L (ref 0–44)
AST: 17 U/L (ref 15–41)
Albumin: 4.2 g/dL (ref 3.5–5.0)
Alkaline Phosphatase: 70 U/L (ref 38–126)
Anion gap: 7 (ref 5–15)
BUN: 18 mg/dL (ref 6–20)
CO2: 27 mmol/L (ref 22–32)
Calcium: 8.9 mg/dL (ref 8.9–10.3)
Chloride: 105 mmol/L (ref 98–111)
Creatinine: 0.88 mg/dL (ref 0.61–1.24)
GFR, Estimated: >60 mL/min (ref >60)
Glucose, Bld: 87 mg/dL (ref 70–99)
Potassium: 4.1 mmol/L (ref 3.5–5.1)
Sodium: 139 mmol/L (ref 135–145)
Total Bilirubin: 0.4 mg/dL (ref 0.3–1.2)
Total Protein: 7 g/dL (ref 6.5–8.1)

## 2022-02-22 LAB — IRON AND IRON BINDING CAPACITY (CC-WL,HP ONLY)
Iron: 103 ug/dL (ref 45–182)
Saturation Ratios: 43 % — ABNORMAL HIGH (ref 17.9–39.5)
TIBC: 238 ug/dL — ABNORMAL LOW (ref 250–450)
UIBC: 135 ug/dL (ref 117–376)

## 2022-02-22 LAB — FERRITIN: Ferritin: 97 ng/mL (ref 24–336)

## 2022-02-22 MED ORDER — SODIUM CHLORIDE 0.9% FLUSH
10.0000 mL | Freq: Once | INTRAVENOUS | Status: AC
  Administered 2022-02-22: 10 mL via INTRAVENOUS

## 2022-02-22 MED ORDER — HEPARIN SOD (PORK) LOCK FLUSH 100 UNIT/ML IV SOLN
500.0000 [IU] | Freq: Once | INTRAVENOUS | Status: AC
  Administered 2022-02-22: 500 [IU] via INTRAVENOUS

## 2022-02-22 NOTE — Patient Instructions (Signed)

## 2022-02-26 ENCOUNTER — Encounter: Payer: Self-pay | Admitting: Gastroenterology

## 2022-02-28 ENCOUNTER — Ambulatory Visit: Payer: No Typology Code available for payment source | Admitting: Nurse Practitioner

## 2022-02-28 NOTE — Progress Notes (Unsigned)
Acute Office Visit  Subjective:    Patient ID: Austin Gilbert, male    DOB: 1976-01-30, 46 y.o.   MRN: [id number]  Chief Complaint  Patient presents with   Ear Fullness    HPI:   Past Medical History:  Diagnosis Date   COVID-19 03/2020   Epilepsy ([place])    Hemochromatosis    Hypertension    Other hemochromatosis    Sleep apnea     Past Surgical History:  Procedure Laterality Date   PORT A CATH INJECTION ([facility]) Right 07/01/2019   TONSILLECTOMY      Family History  Problem Relation Age of Onset   Heart disease Mother    Hypertension Mother    Hyperlipidemia Mother    Transient ischemic attack Mother    COPD Father    Testicular cancer Brother    Colon cancer Neg Hx    Colon polyps Neg Hx    Esophageal cancer Neg Hx    Rectal cancer Neg Hx    Stomach cancer Neg Hx     Social History   Socioeconomic History   Marital status: Married    Spouse name: Not on file   Number of children: Not on file   Years of education: Not on file   Highest education level: Not on file  Occupational History    Employer: [employer]  Tobacco Use   Smoking status: Never   Smokeless tobacco: Never  Vaping Use   Vaping Use: Never used  Substance and Sexual Activity   Alcohol use: Not Currently   Drug use: Not Currently    Types: Marijuana    Comment: Experimented with   Sexual activity: Yes    Partners: Female  Other Topics Concern   Not on file  Social History Narrative   Not on file   Social Determinants of Health   Financial Resource Strain: Low Risk  (12/10/2021)   Overall Financial Resource Strain (CARDIA)    Difficulty of Paying Living Expenses: Not hard at all  Food Insecurity: No Food Insecurity (12/10/2021)   Hunger Vital Sign    Worried About Running Out of Food in the Last Year: Never true    Ran Out of Food in the Last Year: Never true  Transportation Needs: No Transportation Needs (12/10/2021)   PRAPARE - Radiographer, therapeutic (Medical): No    Lack of Transportation (Non-Medical): No  Physical Activity: Inactive (12/10/2021)   Exercise Vital Sign    Days of Exercise per Week: 0 days    Minutes of Exercise per Session: 0 min  Stress: No Stress Concern Present (12/10/2021)   [place]    Feeling of Stress : Not at all  Social Connections: Moderately Integrated (12/10/2021)   Social Connection and Isolation Panel [NHANES]    Frequency of Communication with Friends and Family: More than three times a week    Frequency of Social Gatherings with Friends and Family: More than three times a week    Attends Religious Services: More than 4 times per year    Active Member of [employer] or Organizations: No    Attends Archivist Meetings: Never    Marital Status: Married  Human resources officer Violence: Not At Risk (12/10/2021)   Humiliation, Afraid, Rape, and Kick questionnaire    Fear of Current or Ex-Partner: No    Emotionally Abused: No    Physically Abused: No  Sexually Abused: No    Outpatient Medications Prior to Visit  Medication Sig Dispense Refill   amLODipine (NORVASC) 5 MG tablet TAKE 1 TABLET (5 MG TOTAL) BY MOUTH DAILY. 90 tablet 0   ciclopirox (PENLAC) 8 % solution Apply topically at bedtime. Apply over nail and surrounding skin. Apply daily over previous coat. After seven (7) days, may remove with alcohol and continue cycle. 6.6 mL 0   Multiple Vitamin (MULTIVITAMIN) tablet Take 1 tablet by mouth daily.     terbinafine (LAMISIL) 250 MG tablet Take 1 tablet (250 mg total) by mouth daily. 30 tablet 2   Facility-Administered Medications Prior to Visit  Medication Dose Route Frequency Provider Last Rate Last Admin   0.9 %  sodium chloride infusion  500 mL Intravenous Continuous Jackquline Denmark, MD       alteplase (CATHFLO ACTIVASE) injection 2 mg  2 mg Intracatheter Once Lewis, Dequincy A, MD        No Known  Allergies  Review of Systems     Objective:    Physical Exam  There were no vitals taken for this visit. Wt Readings from Last 3 Encounters:  02/11/22 292 lb (132.5 kg)  02/01/22 292 lb (132.5 kg)  01/11/22 295 lb (133.8 kg)    There are no preventive care reminders to display for this patient.  There are no preventive care reminders to display for this patient.   Lab Results  Component Value Date   TSH 4.480 06/24/2019   Lab Results  Component Value Date   WBC 10.1 02/22/2022   HGB 13.8 02/22/2022   HCT 41.3 02/22/2022   MCV 94.5 02/22/2022   PLT 307 02/22/2022   Lab Results  Component Value Date   NA 139 02/22/2022   K 4.1 02/22/2022   CO2 27 02/22/2022   GLUCOSE 87 02/22/2022   BUN 18 02/22/2022   CREATININE 0.88 02/22/2022   BILITOT 0.4 02/22/2022   ALKPHOS 70 02/22/2022   AST 17 02/22/2022   ALT 21 02/22/2022   PROT 7.0 02/22/2022   ALBUMIN 4.2 02/22/2022   CALCIUM 8.9 02/22/2022   ANIONGAP 7 02/22/2022   EGFR 108 12/10/2021   Lab Results  Component Value Date   CHOL 174 12/10/2021   Lab Results  Component Value Date   HDL 42 12/10/2021   Lab Results  Component Value Date   LDLCALC 102 (H) 12/10/2021   Lab Results  Component Value Date   TRIG 175 (H) 12/10/2021   Lab Results  Component Value Date   CHOLHDL 4.1 12/10/2021   No results found for: "HGBA1C"     Assessment & Plan:   Problem List Items Addressed This Visit   None  No orders of the defined types were placed in this encounter.   No orders of the defined types were placed in this encounter.    Follow-up: No follow-ups on file.  An After Visit Summary was printed and given to the patient.  Rip Harbour, NP [facility] [phone]

## 2022-03-11 ENCOUNTER — Inpatient Hospital Stay: Payer: No Typology Code available for payment source | Attending: Hematology & Oncology

## 2022-03-11 MED ORDER — SODIUM CHLORIDE 0.9% FLUSH
10.0000 mL | Freq: Once | INTRAVENOUS | Status: AC
  Administered 2022-03-11: 10 mL via INTRAVENOUS

## 2022-03-11 MED ORDER — HEPARIN SOD (PORK) LOCK FLUSH 100 UNIT/ML IV SOLN
500.0000 [IU] | Freq: Once | INTRAVENOUS | Status: AC
  Administered 2022-03-11: 500 [IU] via INTRAVENOUS

## 2022-03-11 MED ORDER — SODIUM CHLORIDE 0.9 % IV SOLN: Freq: Once | INTRAVENOUS | Status: AC

## 2022-03-11 NOTE — Patient Instructions (Signed)
Therapeutic Phlebotomy Therapeutic phlebotomy is the planned removal of blood from a person's body for the purpose of treating a medical condition. The procedure is lot like donating blood. Usually, about a pint (470 mL, or 0.47 L) of blood is removed. The average adult has 9-12 pints (4.3-5.7 L) of blood in his or her body. Therapeutic phlebotomy may be used to treat the following medical conditions: Hemochromatosis. This is a condition in which the blood contains too much iron. Polycythemia vera. This is a condition in which the blood contains too many red blood cells. Porphyria cutanea tarda. This is a disease in which an important part of hemoglobin is not made properly. It results in the buildup of abnormal amounts of porphyrins in the body. Sickle cell disease. This is a condition in which the red blood cells form an abnormal crescent shape rather than a round shape. Tell a health care provider about: Any allergies you have. All medicines you are taking, including vitamins, herbs, eye drops, creams, and over-the-counter medicines. Any bleeding problems you have. Any surgeries you have had. Any medical conditions you have. Whether you are pregnant or may be pregnant. What are the risks? Generally, this is a safe procedure. However, problems may occur, including: Nausea or light-headedness. Low blood pressure (hypotension). Soreness, bleeding, swelling, or bruising at the needle insertion site. Infection. What happens before the procedure? Ask your health care provider about: Changing or stopping your regular medicines. This is especially important if you are taking diabetes medicines or blood thinners. Taking medicines such as aspirin and ibuprofen. These medicines can thin your blood. Do not take these medicines unless your health care provider tells you to take them. Taking over-the-counter medicines, vitamins, herbs, and supplements. Wear clothing with sleeves that can be raised  above the elbow. You may have a blood sample taken. Your blood pressure, pulse rate, and breathing rate will be measured. What happens during the procedure?  You may be given a medicine to numb the area (local anesthetic). A tourniquet will be placed on your arm. A needle will be put into one of your veins. Tubing and a collection bag will be attached to the needle. Blood will flow through the needle and tubing into the collection bag. The collection bag will be placed lower than your arm so gravity can help the blood flow into the bag. You may be asked to open and close your hand slowly and continually during the entire collection. After the specified amount of blood has been removed from your body, the collection bag and tubing will be clamped. The needle will be removed from your vein. Pressure will be held on the needle site to stop the bleeding. A bandage (dressing) will be placed over the needle insertion site. The procedure may vary among health care providers and hospitals. What happens after the procedure? Your blood pressure, pulse rate, and breathing rate will be measured after the procedure. You will be encouraged to drink fluids. You will be encouraged to eat a snack to prevent a low blood sugar level. Your recovery will be assessed and monitored. Return to your normal activities as told by your health care provider. Summary Therapeutic phlebotomy is the planned removal of blood from a person's body for the purpose of treating a medical condition. Therapeutic phlebotomy may be used to treat hemochromatosis, polycythemia vera, porphyria cutanea tarda, or sickle cell disease. In the procedure, a needle is inserted and about a pint (470 mL, or 0.47 L) of blood is   removed. The average adult has 9-12 pints (4.3-5.7 L) of blood in the body. This is generally a safe procedure, but it can sometimes cause problems such as nausea, light-headedness, or low blood pressure  (hypotension). This information is not intended to replace advice given to you by your health care provider. Make sure you discuss any questions you have with your health care provider. Document Revised: 08/16/2020 Document Reviewed: 08/16/2020 Elsevier Patient Education  [place].   Dehydration, Adult Dehydration is a condition in which there is not enough water or other fluids in the body. This happens when a person loses more fluids than he or she takes in. Important organs, such as the kidneys, brain, and heart, cannot function without a proper amount of fluids. Any loss of fluids from the body can lead to dehydration. Dehydration can be mild, moderate, or severe. It should be treated right away to prevent it from becoming severe. What are the causes? Dehydration may be caused by: Conditions that cause loss of water or other fluids, such as diarrhea, vomiting, or sweating or urinating a lot. Not drinking enough fluids, especially when you are ill or doing activities that require a lot of energy. Other illnesses and conditions, such as fever or infection. Certain medicines, such as medicines that remove excess fluid from the body (diuretics). Lack of safe drinking water. Not being able to get enough water and food. What increases the risk? The following factors may make you more likely to develop this condition: Having a long-term (chronic) illness that has not been treated properly, such as diabetes, heart disease, or kidney disease. Being 10 years of age or older. Having a disability. Living in a place that is high in altitude, where thinner, drier air causes more fluid loss. Doing exercises that put stress on your body for a long time (endurance sports). What are the signs or symptoms? Symptoms of dehydration depend on how severe it is. Mild or moderate dehydration Thirst. Dry lips or dry mouth. Dizziness or light-headedness, especially when standing up from a seated  position. Muscle cramps. Dark urine. Urine may be the color of tea. Less urine or tears produced than usual. Headache. Severe dehydration Changes in skin. Your skin may be cold and clammy, blotchy, or pale. Your skin also may not return to normal after being lightly pinched and released. Little or no tears, urine, or sweat. Changes in vital signs, such as rapid breathing and low blood pressure. Your pulse may be weak or may be faster than 100 beats a minute when you are sitting still. Other changes, such as: Feeling very thirsty. Sunken eyes. Cold hands and feet. Confusion. Being very tired (lethargic) or having trouble waking from sleep. Short-term weight loss. Loss of consciousness. How is this diagnosed? This condition is diagnosed based on your symptoms and a physical exam. You may have blood and urine tests to help confirm the diagnosis. How is this treated? Treatment for this condition depends on how severe it is. Treatment should be started right away. Do not wait until dehydration becomes severe. Severe dehydration is an emergency and needs to be treated in a hospital. Mild or moderate dehydration can be treated at home. You may be asked to: Drink more fluids. Drink an oral rehydration solution (ORS). This drink helps restore proper amounts of fluids and salts and minerals in the blood (electrolytes). Severe dehydration can be treated: With IV fluids. By correcting abnormal levels of electrolytes. This is often done by giving electrolytes through  a tube that is passed through your nose and into your stomach (nasogastric tube, or NG tube). By treating the underlying cause of dehydration. Follow these instructions at home: Oral rehydration solution If told by your health care provider, drink an ORS: Make an ORS by following instructions on the package. Start by drinking small amounts, about  cup (120 mL) every 5-10 minutes. Slowly increase how much you drink until you have  taken the amount recommended by your health care provider. Eating and drinking        Drink enough clear fluid to keep your urine pale yellow. If you were told to drink an ORS, finish the ORS first and then start slowly drinking other clear fluids. Drink fluids such as: Water. Do not drink only water. Doing that can lead to hyponatremia, which is having too little salt (sodium) in the body. Water from ice chips you suck on. Fruit juice that you have added water to (diluted fruit juice). Low-calorie sports drinks. Eat foods that contain a healthy balance of electrolytes, such as bananas, oranges, potatoes, tomatoes, and spinach. Do not drink alcohol. Avoid the following: Drinks that contain a lot of sugar. These include high-calorie sports drinks, fruit juice that is not diluted, and soda. Caffeine. Foods that are greasy or contain a lot of fat or sugar. General instructions Take over-the-counter and prescription medicines only as told by your health care provider. Do not take sodium tablets. Doing that can lead to having too much sodium in the body (hypernatremia). Return to your normal activities as told by your health care provider. Ask your health care provider what activities are safe for you. Keep all follow-up visits as told by your health care provider. This is important. Contact a health care provider if: You have muscle cramps, pain, or discomfort, such as: Pain in your abdomen and the pain gets worse or stays in one area (localizes). Stiff neck. You have a rash. You are more irritable than usual. You are sleepier or have a harder time waking than usual. You feel weak or dizzy. You feel very thirsty. Get help right away if you have: Any symptoms of severe dehydration. Symptoms of vomiting, such as: You cannot eat or drink without vomiting. Vomiting gets worse or does not go away. Vomit includes blood or green matter (bile). Symptoms that get worse with treatment. A  fever. A severe headache. Problems with urination or bowel movements, such as: Diarrhea that gets worse or does not go away. Blood in your stool (feces). This may cause stool to look black and tarry. Not urinating, or urinating only a small amount of very dark urine, within 6-8 hours. Trouble breathing. These symptoms may represent a serious problem that is an emergency. Do not wait to see if the symptoms will go away. Get medical help right away. Call your local emergency services (911 in the [place] Do not drive yourself to the hospital. Summary Dehydration is a condition in which there is not enough water or other fluids in the body. This happens when a person loses more fluids than he or she takes in. Treatment for this condition depends on how severe it is. Treatment should be started right away. Do not wait until dehydration becomes severe. Drink enough clear fluid to keep your urine pale yellow. If you were told to drink an oral rehydration solution (ORS), finish the ORS first and then start slowly drinking other clear fluids. Take over-the-counter and prescription medicines only as told by your health  care provider. Get help right away if you have any symptoms of severe dehydration. This information is not intended to replace advice given to you by your health care provider. Make sure you discuss any questions you have with your health care provider. Document Revised: 06/27/2021 Document Reviewed: 10/01/2018 Elsevier Patient Education  [place].

## 2022-03-11 NOTE — Progress Notes (Signed)
Austin Gilbert presents today for phlebotomy per MD orders. Phlebotomy procedure started at 0842  and ended at 0911. 500 grams removed. Patient observed for 30 minutes after procedure without any incident. Patient tolerated procedure well. IVF given per pt request. VSS. Pt request to leave at 0948 as he needed to go to work. IV needle removed intact.

## 2022-03-25 ENCOUNTER — Other Ambulatory Visit: Payer: Self-pay | Admitting: Nurse Practitioner

## 2022-03-25 DIAGNOSIS — I10 Essential (primary) hypertension: Secondary | ICD-10-CM

## 2022-04-08 ENCOUNTER — Ambulatory Visit (INDEPENDENT_AMBULATORY_CARE_PROVIDER_SITE_OTHER): Payer: No Typology Code available for payment source | Admitting: Podiatry

## 2022-04-08 DIAGNOSIS — B351 Tinea unguium: Secondary | ICD-10-CM | POA: Diagnosis not present

## 2022-04-08 NOTE — Progress Notes (Signed)
  Subjective:  Patient ID: Austin Gilbert, male    DOB: 1975/05/31,  MRN: [id number]  Chief Complaint  Patient presents with   Follow-up    Left hallux onychomycosis. Patient is taking lamisil and using topical medication.    47 y.o. male presents for follow-up of left hallux nail fungal infection.  He has previously been taking Lamisil for 3 months and using a topical antifungal Penlac 8% solution apply daily to the left hallux nail.  He thinks it may be slightly improved at this time though still see some fungal changes especially in the center portion of the nail.  He denies any pain with the left hallux nail at this time.  Denies any redness swelling or drainage  Past Medical History:  Diagnosis Date   COVID-19 03/2020   Epilepsy ([place])    Hemochromatosis    Hypertension    Other hemochromatosis    Sleep apnea     No Known Allergies  ROS: Negative except as per HPI above  Objective:  General: AAO x3, NAD  Dermatological: Left hallux nail with fungal dystrophy discoloration and a central longitudinal split consistent with onychomycosis.  Slightly improved from prior and the base of the nail is possibly slightly improved however there remains to be fungal dystrophy however possibly improved after Lamisil therapy  Vascular:  Dorsalis Pedis artery and Posterior Tibial artery pedal pulses are 2/4 bilateral.  Capillary fill time < 3 sec to all digits.   Neruologic: Grossly intact via light touch bilateral. Protective threshold intact to all sites bilateral.   Musculoskeletal: Attention directed to the left forefoot there is no to be hallux abductovalgus deformity present.  Mild to moderate in nature.  Increased prominence of the first metatarsal head medially.  Pain palpation of this area.  Gait: Unassisted, Nonantalgic.   No images are attached to the encounter.  Radiographs:  Date: 12/28/2021 XR left foot weightbearing AP/Lateral/Oblique   Findings: hallux valgus deformity  Metatarsal parabola normal. 1st/2nd IMA: increased about 14-16 degrees; TSP: 5.  No significant arthritic changes noted at the first metatarsophalangeal joint. Assessment:   1. Onychomycosis       Plan:  Patient was evaluated and treated and all questions answered.  #Hallux abductovalgus deformity left foot -Had previously discussed surgical intervention but patient is not able to take time off work for the postoperative recovery phase. -Continue with wider toebox shoes and toe spacers as needed -Patient can call to begin surgical planning if he wishes to proceed at any time  #Onychomycosis left hallux nail Onychomycosis -Possible slight improvement status post terbinafine therapy -Continue with Penlac 8% solution applied daily for the next 3 months. -Discussed also discontinuing all antifungal treatments as he is likely reached maximal benefit -Discussed possible nail avulsion either temporary or permanent in the future if he would like to proceed but he does not have any pain and therefore I do not recommend it at this time.    Return if symptoms worsen or fail to improve.          Everitt Amber, DPM [facility]

## 2022-04-12 ENCOUNTER — Inpatient Hospital Stay: Payer: No Typology Code available for payment source | Admitting: Family

## 2022-04-12 ENCOUNTER — Inpatient Hospital Stay: Payer: No Typology Code available for payment source

## 2022-04-14 IMAGING — CT CT CARDIAC CORONARY ARTERY CALCIUM SCORE
3 series · 14 of 20 positions shown, 15 images · non-contrast
Comparison: None.
COMPARISON: None.

Addendum:
EXAM:
OVER-READ INTERPRETATION  CT CHEST

The following report is an over-read performed by radiologist Dr.
over-read does not include interpretation of cardiac or coronary
anatomy or pathology. The coronary calcium score interpretation by
the cardiologist is attached.
CLINICAL DATA: Risk stratification
Coronary Calcium Score
TECHNIQUE: The patient was scanned on a Siemens Force scanner. Axial
non-contrast 3 mm slices were carried out through the heart. The
data set was analyzed on a dedicated work station and scored using
the Agatson method.

[Series 2: casc 3.0 bv41 2 bestsyst 34 % · axial · 0.42mm/px · z∈[-225,-162]mm · 4 of 35 slices shown, 5 images]
[im 7/35  vessel]
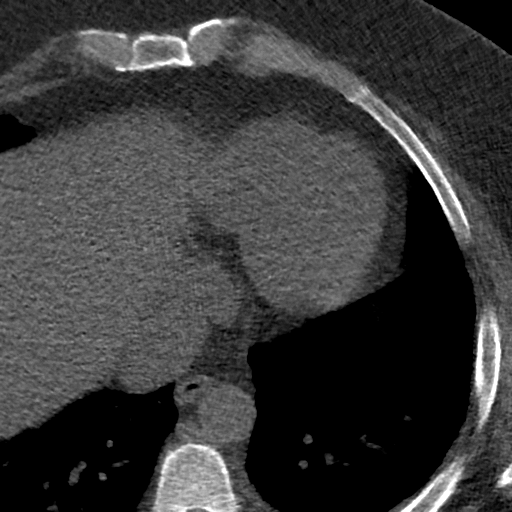
[im 7/35  lung]
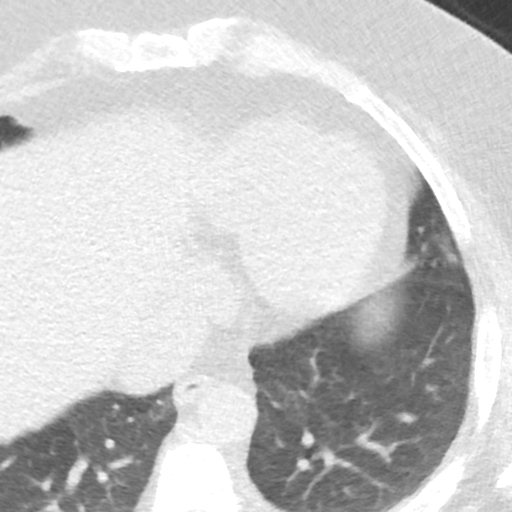
[im 14/35  vessel]
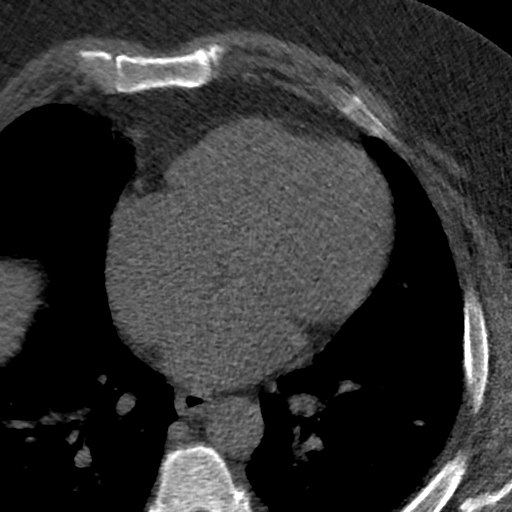
[im 21/35  vessel]
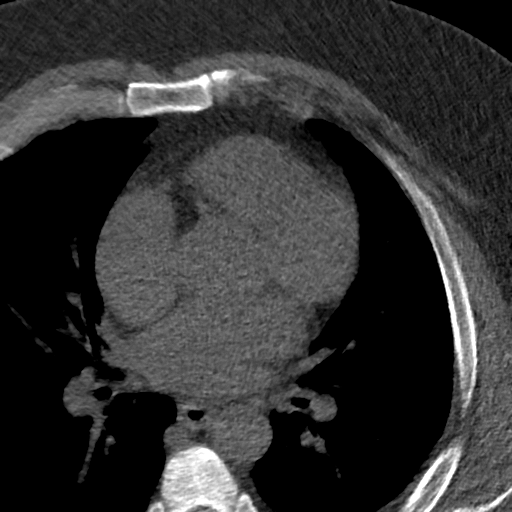
[im 28/35  vessel]
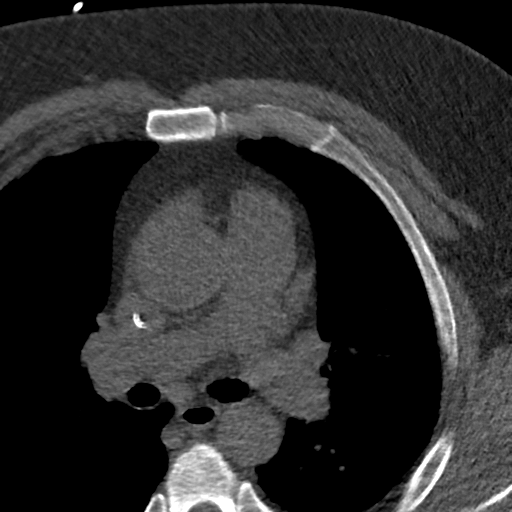

[Series 3: lung 34 % · axial · 0.69mm/px · z∈[-230,-158]mm · 5 of 36 slices shown]
[im 6/36  lung]
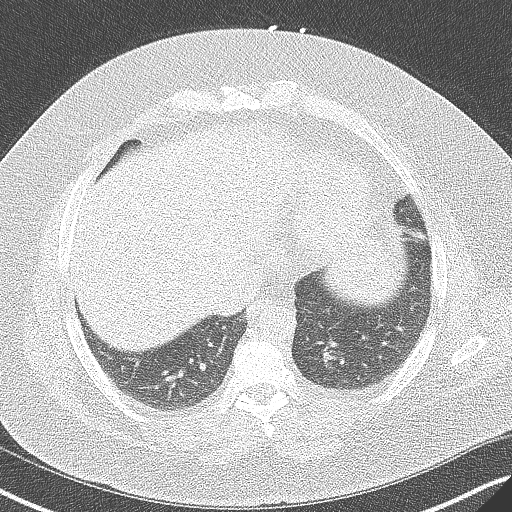
[im 12/36  lung]
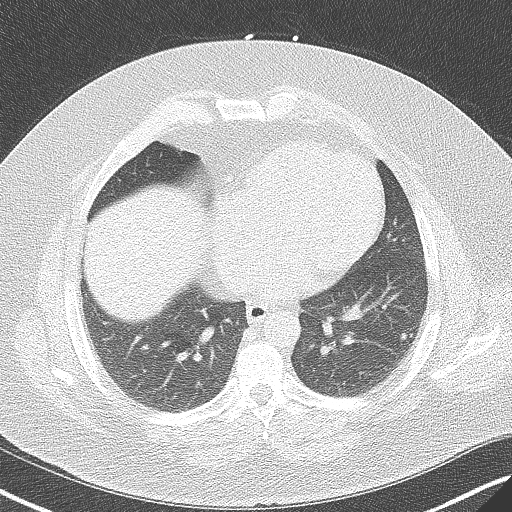
[im 18/36  lung]
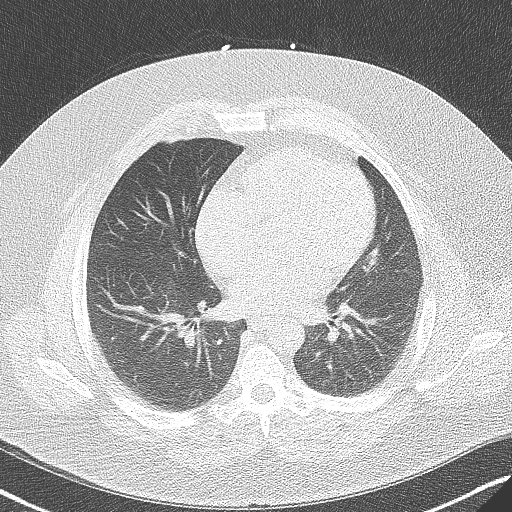
[im 24/36  lung]
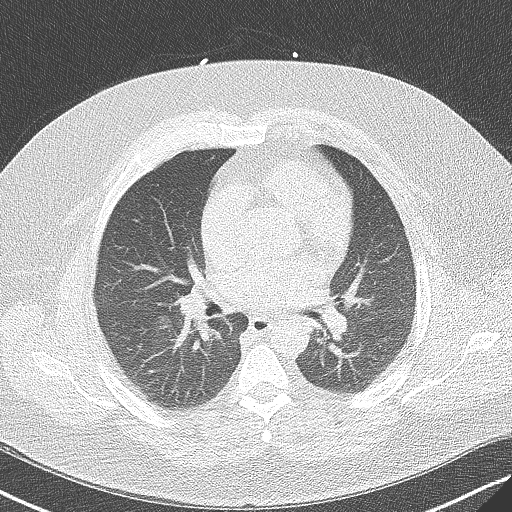
[im 30/36  lung]
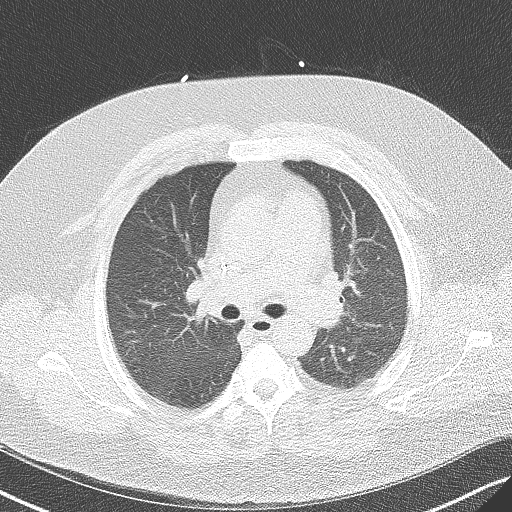

[Series 4: lung st 34 % · axial · 0.69mm/px · z∈[-230,-158]mm · 5 of 36 slices shown]
[im 6/36  lung]
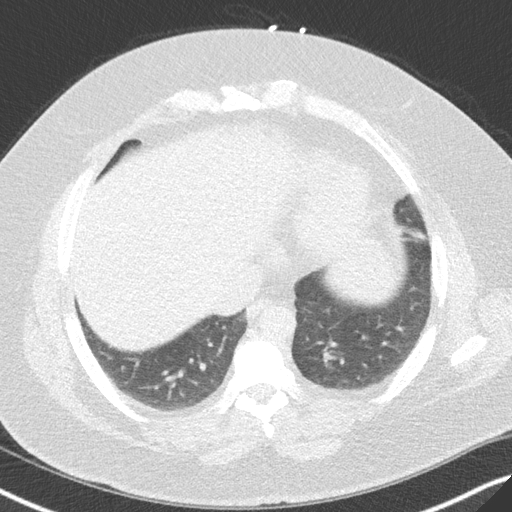
[im 12/36  lung]
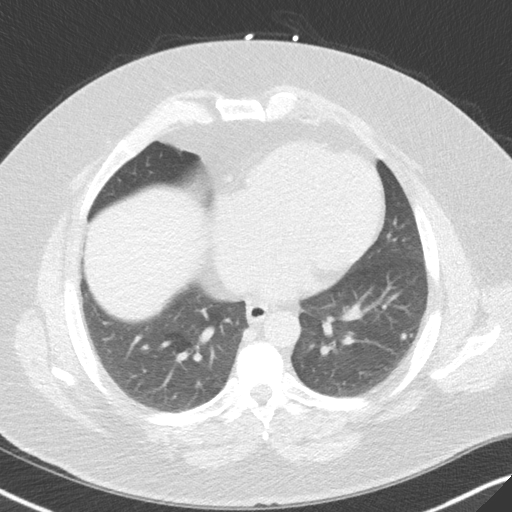
[im 18/36  lung]
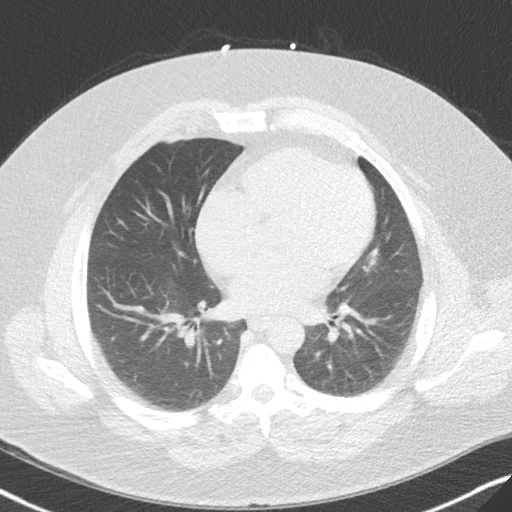
[im 24/36  lung]
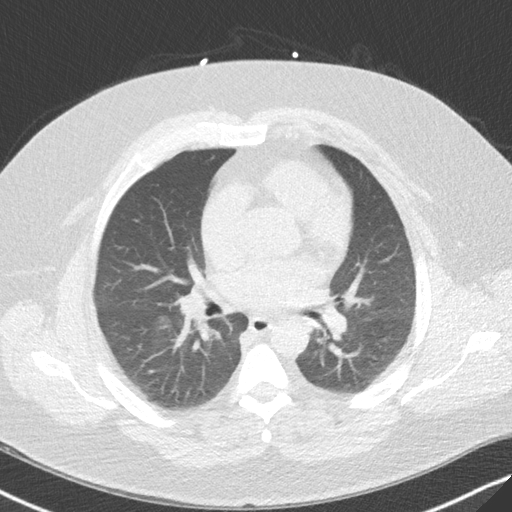
[im 30/36  lung]
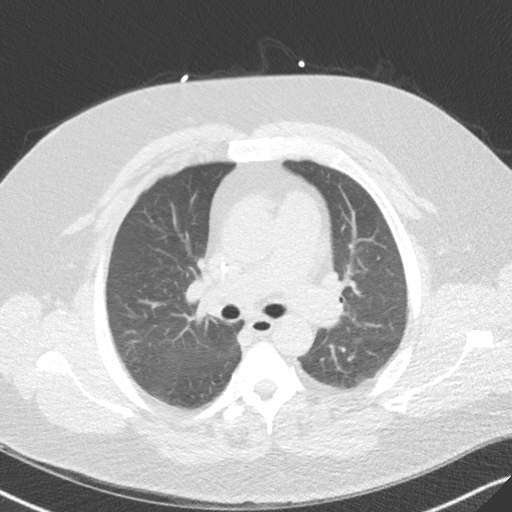

[14 of 20 positions shown; findings below may reference images not displayed]

FINDINGS: Vascular: No visible atherosclerosis and no evidence of aneurysm
involving the visualized aorta.

Mediastinum/Nodes: No pathologic lymphadenopathy within the
visualized mediastinum. Visualized esophagus normal in appearance.

Lungs/Pleura: Minimal scarring in the lingula. Visualized lung
parenchyma otherwise clear. Central airways patent without
significant bronchial wall thickening.

Upper Abdomen: Visualized upper abdomen unremarkable for the
unenhanced technique.

Musculoskeletal: Visualized bony thorax intact.
IMPRESSION: No significant extracardiac findings.
FINDINGS: Non-cardiac: See separate report from [REDACTED].

Ascending Aorta: Normal size, no calcifications.

Pericardium: Normal.

Coronary arteries: Normal origin.
IMPRESSION: Coronary calcium score of 0. This was 0 percentile for age and sex
matched control.

*** End of Addendum ***
EXAM:
OVER-READ INTERPRETATION  CT CHEST

The following report is an over-read performed by radiologist Dr.
over-read does not include interpretation of cardiac or coronary
anatomy or pathology. The coronary calcium score interpretation by
the cardiologist is attached.
FINDINGS: Vascular: No visible atherosclerosis and no evidence of aneurysm
involving the visualized aorta.

Mediastinum/Nodes: No pathologic lymphadenopathy within the
visualized mediastinum. Visualized esophagus normal in appearance.

Lungs/Pleura: Minimal scarring in the lingula. Visualized lung
parenchyma otherwise clear. Central airways patent without
significant bronchial wall thickening.

Upper Abdomen: Visualized upper abdomen unremarkable for the
unenhanced technique.

Musculoskeletal: Visualized bony thorax intact.
IMPRESSION: No significant extracardiac findings.

## 2022-05-28 ENCOUNTER — Inpatient Hospital Stay: Payer: No Typology Code available for payment source | Attending: Hematology & Oncology

## 2022-05-28 ENCOUNTER — Inpatient Hospital Stay (HOSPITAL_BASED_OUTPATIENT_CLINIC_OR_DEPARTMENT_OTHER): Payer: No Typology Code available for payment source | Admitting: Family

## 2022-05-28 ENCOUNTER — Inpatient Hospital Stay: Payer: No Typology Code available for payment source

## 2022-05-28 DIAGNOSIS — R42 Dizziness and giddiness: Secondary | ICD-10-CM | POA: Diagnosis not present

## 2022-05-28 DIAGNOSIS — R5383 Other fatigue: Secondary | ICD-10-CM | POA: Insufficient documentation

## 2022-05-28 LAB — CMP (CANCER CENTER ONLY)
ALT: 20 U/L (ref 0–44)
AST: 24 U/L (ref 15–41)
Albumin: 3.8 g/dL (ref 3.5–5.0)
Alkaline Phosphatase: 73 U/L (ref 38–126)
Anion gap: 6 (ref 5–15)
BUN: 14 mg/dL (ref 6–20)
CO2: 27 mmol/L (ref 22–32)
Calcium: 8.7 mg/dL — ABNORMAL LOW (ref 8.9–10.3)
Chloride: 102 mmol/L (ref 98–111)
Creatinine: 0.87 mg/dL (ref 0.61–1.24)
GFR, Estimated: >60 mL/min (ref >60)
Glucose, Bld: 92 mg/dL (ref 70–99)
Potassium: 3.6 mmol/L (ref 3.5–5.1)
Sodium: 135 mmol/L (ref 135–145)
Total Bilirubin: 0.4 mg/dL (ref 0.3–1.2)
Total Protein: 7.3 g/dL (ref 6.5–8.1)

## 2022-05-28 LAB — CBC WITH DIFFERENTIAL (CANCER CENTER ONLY)
Abs Immature Granulocytes: 0.03 10*3/uL (ref 0.00–0.07)
Basophils Absolute: 0 10*3/uL (ref 0.0–0.1)
Basophils Relative: 0 %
Eosinophils Absolute: 0.3 10*3/uL (ref 0.0–0.5)
Eosinophils Relative: 3 %
HCT: 42.2 % (ref 39.0–52.0)
Hemoglobin: 14.3 g/dL (ref 13.0–17.0)
Immature Granulocytes: 0 %
Lymphocytes Relative: 23 %
Lymphs Abs: 2.6 10*3/uL (ref 0.7–4.0)
MCH: 30.4 pg (ref 26.0–34.0)
MCHC: 33.9 g/dL (ref 30.0–36.0)
MCV: 89.8 fL (ref 80.0–100.0)
Monocytes Absolute: 1 10*3/uL (ref 0.1–1.0)
Monocytes Relative: 9 %
Neutro Abs: 7.2 10*3/uL (ref 1.7–7.7)
Neutrophils Relative %: 65 %
Platelet Count: 282 10*3/uL (ref 150–400)
RBC: 4.7 MIL/uL (ref 4.22–5.81)
RDW: 12.2 % (ref 11.5–15.5)
WBC Count: 11.1 10*3/uL — ABNORMAL HIGH (ref 4.0–10.5)
nRBC: 0 % (ref 0.0–0.2)

## 2022-05-28 LAB — FERRITIN: Ferritin: 60 ng/mL (ref 24–336)

## 2022-05-28 MED ORDER — SODIUM CHLORIDE 0.9% FLUSH
10.0000 mL | INTRAVENOUS | Status: DC | PRN
  Administered 2022-05-28: 10 mL via INTRAVENOUS

## 2022-05-28 MED ORDER — HEPARIN SOD (PORK) LOCK FLUSH 100 UNIT/ML IV SOLN
500.0000 [IU] | Freq: Once | INTRAVENOUS | Status: AC
  Administered 2022-05-28: 500 [IU] via INTRAVENOUS

## 2022-05-28 MED ORDER — ALTEPLASE 2 MG IJ SOLR
2.0000 mg | Freq: Once | INTRAMUSCULAR | Status: AC
  Administered 2022-05-28: 2 mg
  Filled 2022-05-28: qty 2

## 2022-05-28 NOTE — Progress Notes (Signed)
Hematology and Oncology Follow Up Visit  Macen Carrano [id number] [birth year] 47 y.o. 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 Nuttle is here today with his wife for follow-up. He is doing well. He did have some fatigue and dizziness after his phlebotomies and will try his best to remember to hydrate well with at least 2 bottles of water prior to coming in for phlebotomy. He is unable to stay for fluids after due to his job.  No bruising or petechiae.  He noted some bright red blood in his stool after eating spicy food. This resolved since he has stopped eating those foods. No other blood loss noted.  No fever, chills, n/v, cough, rash, dizziness, SOB, chest pain, palpitations, abdominal pain or changes in bowel or bladder habits.  No swelling, tenderness, numbness or tingling in her extremities at this time.  No falls or syncope.  Appetite and hydration are good.   ECOG Performance Status: 1 - Symptomatic but completely ambulatory  Medications:  Allergies as of 05/28/2022   No Known Allergies      Medication List        Accurate as of May 28, 2022  3:46 PM. If you have any questions, ask your nurse or doctor.          amLODipine 5 MG tablet Commonly known as: NORVASC TAKE 1 TABLET (5 MG TOTAL) BY MOUTH DAILY.   ciclopirox 8 % solution Commonly known as: Penlac Apply topically at bedtime. Apply over nail and surrounding skin. Apply daily over previous coat. After seven (7) days, may remove with alcohol and continue cycle.   multivitamin tablet Take 1 tablet by mouth daily.   TESTOSTERONE CYPIONATE IJ Inject 1 Application as directed every 14 (fourteen) days.   ZINC PO Take by mouth daily at 6 (six) AM.        Allergies: No Known Allergies  Past Medical History, Surgical history, Social history, and  Family History were reviewed and updated.  Review of Systems: All other 10 point review of systems is negative.   Physical Exam:  vitals were not taken for this visit.   Wt Readings from Last 3 Encounters:  02/11/22 292 lb (132.5 kg)  02/01/22 292 lb (132.5 kg)  01/11/22 295 lb (133.8 kg)    Ocular: Sclerae unicteric, pupils equal, round and reactive to light Ear-nose-throat: Oropharynx clear, dentition fair Lymphatic: No cervical or supraclavicular adenopathy Lungs no rales or rhonchi, good excursion bilaterally Heart regular rate and rhythm, no murmur appreciated Abd soft, nontender, positive bowel sounds MSK no focal spinal tenderness, no joint edema Neuro: non-focal, well-oriented, appropriate affect Breasts: Deferred   Lab Results  Component Value Date   WBC 10.1 02/22/2022   HGB 13.8 02/22/2022   HCT 41.3 02/22/2022   MCV 94.5 02/22/2022   PLT 307 02/22/2022   Lab Results  Component Value Date   FERRITIN 97 02/22/2022   IRON 103 02/22/2022   TIBC 238 (L) 02/22/2022   UIBC 135 02/22/2022   IRONPCTSAT 43 (H) 02/22/2022   Lab Results  Component Value Date   RBC 4.37 02/22/2022   No results found for: "KPAFRELGTCHN", "LAMBDASER", "KAPLAMBRATIO" No results found for: "IGGSERUM", "IGA", "IGMSERUM" No results found for: "TOTALPROTELP", "ALBUMINELP", "A1GS", "A2GS", "BETS", "BETA2SER", "GAMS", "MSPIKE", "SPEI"   Chemistry      Component Value Date/Time  NA 139 02/22/2022 0935   NA 137 12/10/2021 0927   K 4.1 02/22/2022 0935   CL 105 02/22/2022 0935   CO2 27 02/22/2022 0935   BUN 18 02/22/2022 0935   BUN 13 12/10/2021 0927   CREATININE 0.88 02/22/2022 0935   GLU 94 08/03/2021 0000      Component Value Date/Time   CALCIUM 8.9 02/22/2022 0935   ALKPHOS 70 02/22/2022 0935   AST 17 02/22/2022 0935   ALT 21 02/22/2022 0935   BILITOT 0.4 02/22/2022 0935       Impression and Plan: Mr. Lumley is a very pleasant 47 yo caucasian gentleman with long history of  hemochromatosis.  Iron studies are pending. We will set him up for phlebotomy if needed.  Lab check in 2 months, follow-up in 4 months.   Lottie Dawson, NP 3/26/20243:46 PM

## 2022-05-28 NOTE — Progress Notes (Signed)
TPA was placed at 1518. Blood return at 1545.

## 2022-05-29 ENCOUNTER — Encounter: Payer: Self-pay | Admitting: Family

## 2022-05-29 LAB — IRON AND IRON BINDING CAPACITY (CC-WL,HP ONLY)
Iron: 102 ug/dL (ref 45–182)
Saturation Ratios: 39 % (ref 17.9–39.5)
TIBC: 259 ug/dL (ref 250–450)
UIBC: 157 ug/dL (ref 117–376)

## 2022-06-21 ENCOUNTER — Other Ambulatory Visit: Payer: Self-pay

## 2022-06-21 DIAGNOSIS — I10 Essential (primary) hypertension: Secondary | ICD-10-CM

## 2022-06-21 MED ORDER — AMLODIPINE BESYLATE 5 MG PO TABS: 5.0000 mg | ORAL_TABLET | Freq: Every day | ORAL | 0 refills | Status: DC

## 2022-07-22 ENCOUNTER — Ambulatory Visit: Payer: No Typology Code available for payment source | Admitting: Physician Assistant

## 2022-07-25 ENCOUNTER — Ambulatory Visit: Payer: No Typology Code available for payment source | Admitting: Physician Assistant

## 2022-07-26 ENCOUNTER — Inpatient Hospital Stay: Payer: No Typology Code available for payment source

## 2022-07-30 ENCOUNTER — Inpatient Hospital Stay: Payer: No Typology Code available for payment source | Attending: Hematology & Oncology

## 2022-07-30 ENCOUNTER — Inpatient Hospital Stay: Payer: No Typology Code available for payment source

## 2022-07-30 DIAGNOSIS — Z79899 Other long term (current) drug therapy: Secondary | ICD-10-CM | POA: Insufficient documentation

## 2022-07-30 LAB — CMP (CANCER CENTER ONLY)
ALT: 18 U/L (ref 0–44)
AST: 20 U/L (ref 15–41)
Albumin: 4.4 g/dL (ref 3.5–5.0)
Alkaline Phosphatase: 73 U/L (ref 38–126)
Anion gap: 9 (ref 5–15)
BUN: 17 mg/dL (ref 6–20)
CO2: 26 mmol/L (ref 22–32)
Calcium: 9.4 mg/dL (ref 8.9–10.3)
Chloride: 104 mmol/L (ref 98–111)
Creatinine: 0.9 mg/dL (ref 0.61–1.24)
GFR, Estimated: >60 mL/min (ref >60)
Glucose, Bld: 149 mg/dL — ABNORMAL HIGH (ref 70–99)
Potassium: 3.7 mmol/L (ref 3.5–5.1)
Sodium: 139 mmol/L (ref 135–145)
Total Bilirubin: 0.4 mg/dL (ref 0.3–1.2)
Total Protein: 6.9 g/dL (ref 6.5–8.1)

## 2022-07-30 LAB — CBC WITH DIFFERENTIAL (CANCER CENTER ONLY)
Abs Immature Granulocytes: 0.03 10*3/uL (ref 0.00–0.07)
Basophils Absolute: 0 10*3/uL (ref 0.0–0.1)
Basophils Relative: 0 %
Eosinophils Absolute: 0.2 10*3/uL (ref 0.0–0.5)
Eosinophils Relative: 2 %
HCT: 42.3 % (ref 39.0–52.0)
Hemoglobin: 14.2 g/dL (ref 13.0–17.0)
Immature Granulocytes: 0 %
Lymphocytes Relative: 22 %
Lymphs Abs: 2.4 10*3/uL (ref 0.7–4.0)
MCH: 30.7 pg (ref 26.0–34.0)
MCHC: 33.6 g/dL (ref 30.0–36.0)
MCV: 91.4 fL (ref 80.0–100.0)
Monocytes Absolute: 0.5 10*3/uL (ref 0.1–1.0)
Monocytes Relative: 5 %
Neutro Abs: 7.3 10*3/uL (ref 1.7–7.7)
Neutrophils Relative %: 71 %
Platelet Count: 263 10*3/uL (ref 150–400)
RBC: 4.63 MIL/uL (ref 4.22–5.81)
RDW: 12.8 % (ref 11.5–15.5)
WBC Count: 10.5 10*3/uL (ref 4.0–10.5)
nRBC: 0 % (ref 0.0–0.2)

## 2022-07-30 LAB — FERRITIN: Ferritin: 80 ng/mL (ref 24–336)

## 2022-07-30 MED ORDER — SODIUM CHLORIDE 0.9% FLUSH
10.0000 mL | Freq: Once | INTRAVENOUS | Status: AC
  Administered 2022-07-30: 10 mL via INTRAVENOUS

## 2022-07-30 MED ORDER — HEPARIN SOD (PORK) LOCK FLUSH 100 UNIT/ML IV SOLN
500.0000 [IU] | Freq: Once | INTRAVENOUS | Status: AC
  Administered 2022-07-30: 500 [IU] via INTRAVENOUS

## 2022-07-30 NOTE — Patient Instructions (Signed)

## 2022-07-31 LAB — IRON AND IRON BINDING CAPACITY (CC-WL,HP ONLY)
Iron: 107 ug/dL (ref 45–182)
Saturation Ratios: 43 % — ABNORMAL HIGH (ref 17.9–39.5)
TIBC: 252 ug/dL (ref 250–450)
UIBC: 145 ug/dL (ref 117–376)

## 2022-09-16 ENCOUNTER — Telehealth: Payer: Self-pay

## 2022-09-16 NOTE — Telephone Encounter (Signed)
Patient called stating that he was stung on the hand and the head a few days ago.  He denies any swelling or tightness in his throat and denies any SHOB.  Patient has taken OTC Ibuprofen two days ago.  He c/o itching and swelling at the site.  Advised to take OTC benadryl and apply cortisone cream to the site.  If this does not help he is to call back.

## 2022-10-01 ENCOUNTER — Inpatient Hospital Stay: Payer: No Typology Code available for payment source

## 2022-10-01 ENCOUNTER — Inpatient Hospital Stay: Payer: No Typology Code available for payment source | Admitting: Family

## 2022-10-16 ENCOUNTER — Inpatient Hospital Stay (HOSPITAL_BASED_OUTPATIENT_CLINIC_OR_DEPARTMENT_OTHER): Payer: No Typology Code available for payment source | Admitting: Family

## 2022-10-16 ENCOUNTER — Inpatient Hospital Stay: Payer: No Typology Code available for payment source | Attending: Hematology & Oncology

## 2022-10-16 ENCOUNTER — Other Ambulatory Visit: Payer: Self-pay

## 2022-10-16 ENCOUNTER — Encounter: Payer: Self-pay | Admitting: Family

## 2022-10-16 ENCOUNTER — Inpatient Hospital Stay: Payer: No Typology Code available for payment source

## 2022-10-16 ENCOUNTER — Other Ambulatory Visit: Payer: Self-pay | Admitting: Family

## 2022-10-16 DIAGNOSIS — R0602 Shortness of breath: Secondary | ICD-10-CM | POA: Insufficient documentation

## 2022-10-16 DIAGNOSIS — Z79899 Other long term (current) drug therapy: Secondary | ICD-10-CM | POA: Diagnosis not present

## 2022-10-16 LAB — CMP (CANCER CENTER ONLY)
ALT: 18 U/L (ref 0–44)
AST: 17 U/L (ref 15–41)
Albumin: 4.1 g/dL (ref 3.5–5.0)
Alkaline Phosphatase: 70 U/L (ref 38–126)
Anion gap: 7 (ref 5–15)
BUN: 16 mg/dL (ref 6–20)
CO2: 28 mmol/L (ref 22–32)
Calcium: 8.7 mg/dL — ABNORMAL LOW (ref 8.9–10.3)
Chloride: 104 mmol/L (ref 98–111)
Creatinine: 0.85 mg/dL (ref 0.61–1.24)
GFR, Estimated: >60 mL/min (ref >60)
Glucose, Bld: 84 mg/dL (ref 70–99)
Potassium: 3.6 mmol/L (ref 3.5–5.1)
Sodium: 139 mmol/L (ref 135–145)
Total Bilirubin: 0.5 mg/dL (ref 0.3–1.2)
Total Protein: 6.8 g/dL (ref 6.5–8.1)

## 2022-10-16 LAB — FERRITIN: Ferritin: 95 ng/mL (ref 24–336)

## 2022-10-16 LAB — CBC WITH DIFFERENTIAL (CANCER CENTER ONLY)
Abs Immature Granulocytes: 0.02 10*3/uL (ref 0.00–0.07)
Basophils Absolute: 0 10*3/uL (ref 0.0–0.1)
Basophils Relative: 0 %
Eosinophils Absolute: 0.2 10*3/uL (ref 0.0–0.5)
Eosinophils Relative: 2 %
HCT: 41.4 % (ref 39.0–52.0)
Hemoglobin: 14 g/dL (ref 13.0–17.0)
Immature Granulocytes: 0 %
Lymphocytes Relative: 23 %
Lymphs Abs: 2.2 10*3/uL (ref 0.7–4.0)
MCH: 31.4 pg (ref 26.0–34.0)
MCHC: 33.8 g/dL (ref 30.0–36.0)
MCV: 92.8 fL (ref 80.0–100.0)
Monocytes Absolute: 0.8 10*3/uL (ref 0.1–1.0)
Monocytes Relative: 9 %
Neutro Abs: 6.3 10*3/uL (ref 1.7–7.7)
Neutrophils Relative %: 66 %
Platelet Count: 258 10*3/uL (ref 150–400)
RBC: 4.46 MIL/uL (ref 4.22–5.81)
RDW: 12.4 % (ref 11.5–15.5)
WBC Count: 9.6 10*3/uL (ref 4.0–10.5)
nRBC: 0 % (ref 0.0–0.2)

## 2022-10-16 LAB — IRON AND IRON BINDING CAPACITY (CC-WL,HP ONLY)
Iron: 139 ug/dL (ref 45–182)
Saturation Ratios: 61 % — ABNORMAL HIGH (ref 17.9–39.5)
TIBC: 228 ug/dL — ABNORMAL LOW (ref 250–450)
UIBC: 89 ug/dL — ABNORMAL LOW (ref 117–376)

## 2022-10-16 NOTE — Patient Instructions (Signed)

## 2022-10-16 NOTE — Progress Notes (Signed)
Hematology and Oncology Follow Up Visit  Austin Gilbert [id number] 01-Apr-1975 47 y.o. 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 He is doing well and has no complaints at this time.  Iron studies are pending.  No issues with fever, chills, n/v, cough, rash, dizziness, chest pain, palpitations, abdominal pain or changes in bowel or bladder habits.  He notes occasional SOB with over exertion (stairs).  He is eating healthier and hydrating well throughout the day. He plans to add in more exercise to help lose weight.  Weight is 308 lbs.  No swelling, tenderness, numbness or tingling in his extremities at this time.  No falls or syncope reported.  No blood loss, bruising or petechiae.   ECOG Performance Status: 0 - Asymptomatic  Medications:  Allergies as of 10/16/2022   No Known Allergies      Medication List        Accurate as of October 16, 2022  9:56 AM. If you have any questions, ask your nurse or doctor.          amLODipine 5 MG tablet Commonly known as: NORVASC Take 1 tablet (5 mg total) by mouth daily.   ciclopirox 8 % solution Commonly known as: Penlac Apply topically at bedtime. Apply over nail and surrounding skin. Apply daily over previous coat. After seven (7) days, may remove with alcohol and continue cycle.   multivitamin tablet Take 1 tablet by mouth daily.   TESTOSTERONE CYPIONATE IJ Inject 1 Application as directed every 14 (fourteen) days.   ZINC PO Take by mouth daily at 6 (six) AM.        Allergies: No Known Allergies  Past Medical History, Surgical history, Social history, and Family History were reviewed and updated.  Review of Systems: All other 10 point review of systems is negative.   Physical Exam:  height is 5\' 7"  (1.702 m) and  weight is 308 lb (139.7 kg) (abnormal). His oral temperature is 98 F (36.7 C). His blood pressure is 126/74 and his pulse is 78. His respiration is 20 and oxygen saturation is 98%.   Wt Readings from Last 3 Encounters:  10/16/22 (!) 308 lb (139.7 kg)  02/11/22 292 lb (132.5 kg)  02/01/22 292 lb (132.5 kg)    Ocular: Sclerae unicteric, pupils equal, round and reactive to light Ear-nose-throat: Oropharynx clear, dentition fair Lymphatic: No cervical or supraclavicular adenopathy Lungs no rales or rhonchi, good excursion bilaterally Heart regular rate and rhythm, no murmur appreciated Abd soft, nontender, positive bowel sounds MSK no focal spinal tenderness, no joint edema Neuro: non-focal, well-oriented, appropriate affect Breasts: Deferred   Lab Results  Component Value Date   WBC 9.6 10/16/2022   HGB 14.0 10/16/2022   HCT 41.4 10/16/2022   MCV 92.8 10/16/2022   PLT 258 10/16/2022   Lab Results  Component Value Date   FERRITIN 80 07/30/2022   IRON 107 07/30/2022   TIBC 252 07/30/2022   UIBC 145 07/30/2022   IRONPCTSAT 43 (H) 07/30/2022   Lab Results  Component Value Date   RBC 4.46 10/16/2022   No results found for: "KPAFRELGTCHN", "LAMBDASER", "KAPLAMBRATIO" No results found for: "IGGSERUM", "IGA", "IGMSERUM" No results found for: "TOTALPROTELP", "ALBUMINELP", "A1GS", "A2GS", "BETS", "BETA2SER", "GAMS", "MSPIKE", "SPEI"   Chemistry  Component Value Date/Time   NA 139 10/16/2022 0840   NA 137 12/10/2021 0927   K 3.6 10/16/2022 0840   CL 104 10/16/2022 0840   CO2 28 10/16/2022 0840   BUN 16 10/16/2022 0840   BUN 13 12/10/2021 0927   CREATININE 0.85 10/16/2022 0840   GLU 94 08/03/2021 0000      Component Value Date/Time   CALCIUM 8.7 (L) 10/16/2022 0840   ALKPHOS 70 10/16/2022 0840   AST 17 10/16/2022 0840   ALT 18 10/16/2022 0840   BILITOT 0.5 10/16/2022 0840       Impression and Plan: Austin Gilbert is a very pleasant 47 yo caucasian gentleman with  long history of hemochromatosis.  Iron studies are pending. We will set him up for phlebotomy if needed.  Lab check in 3 months, follow-up in 6 months.   Eileen Stanford, NP 8/14/20249:56 AM

## 2022-10-22 ENCOUNTER — Inpatient Hospital Stay: Payer: No Typology Code available for payment source

## 2022-10-22 MED ORDER — SODIUM CHLORIDE 0.9 % IV SOLN: Freq: Once | INTRAVENOUS | Status: AC

## 2022-10-22 NOTE — Progress Notes (Signed)
0900 Patient states he is ok to leave.  No dizziness or weakness.  VSS.

## 2022-10-22 NOTE — Progress Notes (Signed)
Austin Gilbert presents today for phlebotomy per MD orders. Phlebotomy procedure started at 0817 and ended at 0940. 500 grams removed. NS 250 mls up at wide open. Patient observed for 30 minutes after procedure without any incident. Patient tolerated procedure well. IV needle removed intact.

## 2022-10-22 NOTE — Patient Instructions (Signed)

## 2022-12-16 NOTE — Progress Notes (Unsigned)
Subjective:  Patient ID: Austin Gilbert, male    DOB: 09-09-1975  Age: 47 y.o. MRN: [id number]  No chief complaint on file.   HPI  Well Adult Physical: Patient here for a comprehensive physical exam.The patient reports {problems:16946} Do you take any herbs or supplements that were not prescribed by a doctor? {yes/no/not asked:9010} Are you taking calcium supplements? {yes/no:63} Are you taking aspirin daily? {yes/no:63}  Encounter for general adult medical examination without abnormal findings  Physical ("At Risk" items are starred): Patient's last physical exam was 1 year ago .  Patient wears a seat belt, has smoke detectors, has carbon monoxide detectors, practices appropriate gun safety, and wears sunscreen with extended sun exposure. Dental Care: biannual cleanings, brushes and flosses daily. Ophthalmology/Optometry: Annual visit.  Hearing loss: none Vision impairments: none Last PSA:     12/10/2021    8:57 AM 09/24/2019    7:44 AM  Depression screen PHQ 2/9  Decreased Interest 0 0  Down, Depressed, Hopeless 0 0  PHQ - 2 Score 0 0  Altered sleeping 0   Tired, decreased energy 0   Change in appetite 0   Feeling bad or failure about yourself  0   Trouble concentrating 0   Moving slowly or fidgety/restless 0   Suicidal thoughts 0   PHQ-9 Score 0   Difficult doing work/chores Not difficult at all          06/15/2021    9:29 AM 12/10/2021    8:57 AM 12/28/2021    2:27 PM 02/01/2022   10:17 AM 03/11/2022    8:41 AM  Fall Risk  Falls in the past year?  0     Was there an injury with Fall?  0     Fall Risk Category Calculator  0     Fall Risk Category (Retired)  Low     (RETIRED) Patient Fall Risk Level Low fall risk Low fall risk Low fall risk Low fall risk Low fall risk  Patient at Risk for Falls Due to  No Fall Risks     Fall risk Follow up  Falls evaluation completed                 Past Medical History:  Diagnosis Date   COVID-19 03/2020   Epilepsy ([facility])     Hemochromatosis    Hypertension    Other hemochromatosis    Sleep apnea    Past Surgical History:  Procedure Laterality Date   PORT A CATH INJECTION ([facility] HX) Right 07/01/2019   TONSILLECTOMY      Family History  Problem Relation Age of Onset   Heart disease Mother    Hypertension Mother    Hyperlipidemia Mother    Transient ischemic attack Mother    COPD Father    Testicular cancer Brother    Colon cancer Neg Hx    Colon polyps Neg Hx    Esophageal cancer Neg Hx    Rectal cancer Neg Hx    Stomach cancer Neg Hx    Social History   Socioeconomic History   Marital status: Married    Spouse name: Not on file   Number of children: Not on file   Years of education: Not on file   Highest education level: Not on file  Occupational History    Employer: [employer]  Tobacco Use   Smoking status: Never   Smokeless tobacco: Never  Vaping Use   Vaping status: Never Used  Substance and  Sexual Activity   Alcohol use: Not Currently   Drug use: Not Currently    Types: Marijuana    Comment: Experimented with   Sexual activity: Yes    Partners: Female  Other Topics Concern   Not on file  Social History Narrative   Not on file   Social Determinants of Health   Financial Resource Strain: Low Risk  (12/10/2021)   Overall Financial Resource Strain (CARDIA)    Difficulty of Paying Living Expenses: Not hard at all  Food Insecurity: No Food Insecurity (12/10/2021)   Hunger Vital Sign    Worried About Running Out of Food in the Last Year: Never true    Ran Out of Food in the Last Year: Never true  Transportation Needs: No Transportation Needs (12/10/2021)   PRAPARE - Administrator, Civil Service (Medical): No    Lack of Transportation (Non-Medical): No  Physical Activity: Inactive (12/10/2021)   Exercise Vital Sign    Days of Exercise per Week: 0 days    Minutes of Exercise per Session: 0 min  Stress: No Stress Concern Present (12/10/2021)   [employer] of Occupational Health - Occupational Stress Questionnaire    Feeling of Stress : Not at all  Social Connections: Moderately Integrated (12/10/2021)   Social Connection and Isolation Panel [NHANES]    Frequency of Communication with Friends and Family: More than three times a week    Frequency of Social Gatherings with Friends and Family: More than three times a week    Attends Religious Services: More than 4 times per year    Active Member of [employer] or Organizations: No    Attends Banker Meetings: Never    Marital Status: Married   Review of Systems   Objective:  There were no vitals taken for this visit.     10/22/2022    8:41 AM 10/22/2022    8:00 AM 10/16/2022    8:55 AM  BP/Weight  Systolic BP 148 133 126  Diastolic BP 82 83 74  Wt. (Lbs)   308  BMI   48.24 kg/m2    Physical Exam  Lab Results  Component Value Date   WBC 9.6 10/16/2022   HGB 14.0 10/16/2022   HCT 41.4 10/16/2022   PLT 258 10/16/2022   GLUCOSE 84 10/16/2022   CHOL 174 12/10/2021   TRIG 175 (H) 12/10/2021   HDL 42 12/10/2021   LDLCALC 102 (H) 12/10/2021   ALT 18 10/16/2022   AST 17 10/16/2022   NA 139 10/16/2022   K 3.6 10/16/2022   CL 104 10/16/2022   CREATININE 0.85 10/16/2022   BUN 16 10/16/2022   CO2 28 10/16/2022   TSH 4.480 06/24/2019      Assessment & Plan:  There are no diagnoses linked to this encounter.   There is no height or weight on file to calculate BMI.   These are the goals we discussed:  Goals   None      This is a list of the screening recommended for you and due dates:  Health Maintenance  Topic Date Due   Flu Shot  Never done   Colon Cancer Screening  02/11/2025   DTaP/Tdap/Td vaccine (2 - Td or Tdap) 12/11/2031   HPV Vaccine  Aged Out   COVID-19 Vaccine  Discontinued     No orders of the defined types were placed in this encounter.    Follow-up: No follow-ups on file.  An After Visit Summary  was printed and given to the  patient.  Langley Gauss, [facility] [phone]

## 2022-12-17 ENCOUNTER — Ambulatory Visit: Payer: No Typology Code available for payment source | Admitting: Physician Assistant

## 2022-12-17 ENCOUNTER — Encounter: Payer: Self-pay | Admitting: Physician Assistant

## 2022-12-17 VITALS — BP 96/60 | HR 76 | Temp 97.4°F | Resp 14 | Ht 67.0 in | Wt 298.0 lb

## 2022-12-17 DIAGNOSIS — M25551 Pain in right hip: Secondary | ICD-10-CM | POA: Diagnosis not present

## 2022-12-17 DIAGNOSIS — Z6841 Body Mass Index (BMI) 40.0 and over, adult: Secondary | ICD-10-CM

## 2022-12-17 DIAGNOSIS — E66813 Obesity, class 3: Secondary | ICD-10-CM

## 2022-12-17 DIAGNOSIS — G4730 Sleep apnea, unspecified: Secondary | ICD-10-CM | POA: Diagnosis not present

## 2022-12-17 DIAGNOSIS — G8929 Other chronic pain: Secondary | ICD-10-CM

## 2022-12-17 DIAGNOSIS — Z0000 Encounter for general adult medical examination without abnormal findings: Secondary | ICD-10-CM

## 2022-12-17 HISTORY — DX: Body Mass Index (BMI) 40.0 and over, adult: Z684

## 2022-12-17 HISTORY — DX: Obesity, class 3: E66.813

## 2022-12-17 HISTORY — DX: Other chronic pain: G89.29

## 2022-12-17 HISTORY — DX: Encounter for general adult medical examination without abnormal findings: Z00.00

## 2022-12-17 NOTE — Assessment & Plan Note (Signed)
Sent for x-ray or right hip Will set up chronic visit to look more into treatment

## 2022-12-17 NOTE — Assessment & Plan Note (Signed)
Will schedule follow up to discuss weight management.  Labs drawn today Will discuss at follow up

## 2022-12-17 NOTE — Assessment & Plan Note (Signed)
Old CPAP machine broken and not helping him with sleep Would like a referral for a new sleep study to see if he needs a new machine or an adjustment to his machine. Referral sent for at home sleep study.

## 2022-12-18 LAB — LIPID PANEL
Chol/HDL Ratio: 3.7 {ratio} (ref 0.0–5.0)
Cholesterol, Total: 168 mg/dL (ref 100–199)
HDL: 45 mg/dL (ref >39)
LDL Chol Calc (NIH): 106 mg/dL — ABNORMAL HIGH (ref 0–99)
Triglycerides: 90 mg/dL (ref 0–149)
VLDL Cholesterol Cal: 17 mg/dL (ref 5–40)

## 2022-12-18 LAB — T4, FREE: Free T4: 1.3 ng/dL (ref 0.82–1.77)

## 2022-12-18 LAB — HEMOGLOBIN A1C
Est. average glucose Bld gHb Est-mCnc: 108 mg/dL
Hgb A1c MFr Bld: 5.4 % (ref 4.8–5.6)

## 2022-12-18 LAB — TSH: TSH: 2.94 u[IU]/mL (ref 0.450–4.500)

## 2023-01-09 ENCOUNTER — Ambulatory Visit: Payer: No Typology Code available for payment source | Admitting: Physician Assistant

## 2023-01-09 ENCOUNTER — Encounter: Payer: Self-pay | Admitting: Physician Assistant

## 2023-01-09 VITALS — BP 140/80 | HR 86 | Temp 97.5°F | Resp 14 | Ht 67.0 in | Wt 299.0 lb

## 2023-01-09 DIAGNOSIS — G4730 Sleep apnea, unspecified: Secondary | ICD-10-CM

## 2023-01-09 DIAGNOSIS — Z6841 Body Mass Index (BMI) 40.0 and over, adult: Secondary | ICD-10-CM

## 2023-01-09 DIAGNOSIS — Z8719 Personal history of other diseases of the digestive system: Secondary | ICD-10-CM

## 2023-01-09 DIAGNOSIS — E66813 Obesity, class 3: Secondary | ICD-10-CM

## 2023-01-09 DIAGNOSIS — I10 Essential (primary) hypertension: Secondary | ICD-10-CM

## 2023-01-09 HISTORY — DX: Personal history of other diseases of the digestive system: Z87.19

## 2023-01-09 MED ORDER — AMLODIPINE BESYLATE 5 MG PO TABS
5.0000 mg | ORAL_TABLET | Freq: Every day | ORAL | 0 refills | Status: DC
Start: 2023-01-09 — End: 2023-07-30

## 2023-01-09 MED ORDER — TIRZEPATIDE-WEIGHT MANAGEMENT 2.5 MG/0.5ML ~~LOC~~ SOAJ
2.5000 mg | SUBCUTANEOUS | 3 refills | Status: DC
Start: 2023-01-09 — End: 2023-02-28

## 2023-01-09 NOTE — Assessment & Plan Note (Signed)
Sent for at home sleep study Will have follow up depending on results Will look into decreasing weight to help with treatment.

## 2023-01-09 NOTE — Progress Notes (Signed)
Subjective:  Patient ID: Austin Gilbert, male    DOB: August 20, 1975  Age: 47 y.o. MRN: [id number]  Chief Complaint  Patient presents with   Weight Loss    HPI   Patient is here for weight loss. He has tried to eat healthy. He mentioned that sometime he cannot eat in the day because he is too busy. He eats late in the night. States that it is usually not after 9:00pm.   Patient admits to a history of fatty liver disease. States that back in [place] he was first diagnosed with it. Does not remember exactly when he was initially diagnosed. Is unable to provide scans showing he currently has it.   Admits to worsening sleep apnea. His spouse stated it has now started affecting her sleep how loud he will snore and then stop breathing to then have a sudden loud gasp of breath.   Patient has worked on monitoring his diet and increasing exercise. 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 States that it is a very physically demanding job. States that he has also been adjusting his diet by decreasing carbs and sweets. Has not seen any changes in weight. Would like to try adding medication to help him. Has not been on any other medication for helping with weight management. Agrees to only be on one medication for weight management. Will continue to have routine follow ups to monitor side effects and adjust the dose as needed.      12/17/2022    9:53 AM 12/10/2021    8:57 AM 09/24/2019    7:44 AM  Depression screen PHQ 2/9  Decreased Interest 1 0 0  Down, Depressed, Hopeless 1 0 0  PHQ - 2 Score 2 0 0  Altered sleeping 3 0   Tired, decreased energy 3 0   Change in appetite 3 0   Feeling bad or failure about yourself  1 0   Trouble concentrating 1 0   Moving slowly or fidgety/restless 0 0   Suicidal thoughts 0 0   PHQ-9 Score 13 0   Difficult doing work/chores Extremely dIfficult Not difficult at all         12/17/2022    9:53 AM  Fall Risk   Falls in the past year?  1  Number falls in past yr: 0  Injury with Fall? 0  Risk for fall due to : Impaired mobility  Follow up Education provided    Patient Care Team: Langley Gauss, [place] as PCP - General (Physician Assistant) Erenest Blank, NP as Nurse Practitioner (Nurse Practitioner)   Review of Systems  Constitutional:  Negative for chills, fatigue, fever and unexpected weight change.  HENT:  Negative for congestion, ear pain, sinus pain and sore throat.   Respiratory:  Positive for shortness of breath. Negative for cough.   Cardiovascular:  Negative for chest pain and palpitations.  Gastrointestinal:  Negative for abdominal pain, blood in stool, constipation, diarrhea, nausea and vomiting.  Endocrine: Negative for polydipsia.  Genitourinary:  Negative for dysuria.  Musculoskeletal:  Negative for back pain.  Skin:  Negative for rash.  Neurological:  Negative for headaches.    Current Outpatient Medications on File Prior to Visit  Medication Sig Dispense Refill   Multiple Vitamin (MULTIVITAMIN) tablet Take 1 tablet by mouth daily.     Current Facility-Administered Medications on File Prior to Visit  Medication Dose Route Frequency Provider Last Rate Last Admin   alteplase (CATHFLO ACTIVASE) injection  2 mg  2 mg Intracatheter Once Weston Settle, MD       Past Medical History:  Diagnosis Date   COVID-19 03/2020   Epilepsy (HCC)    Hemochromatosis    Hypertension    Other hemochromatosis    Sleep apnea    Past Surgical History:  Procedure Laterality Date   PORT A CATH INJECTION ([facility] HX) Right 07/01/2019   TONSILLECTOMY      Family History  Problem Relation Age of Onset   Heart disease Mother    Hypertension Mother    Hyperlipidemia Mother    Transient ischemic attack Mother    COPD Father    Testicular cancer Brother    Colon cancer Neg Hx    Colon polyps Neg Hx    Esophageal cancer Neg Hx    Rectal cancer Neg Hx    Stomach cancer Neg Hx    Social History   Socioeconomic  History   Marital status: Married    Spouse name: Not on file   Number of children: Not on file   Years of education: Not on file   Highest education level: Not on file  Occupational History    Employer: [employer]  Tobacco Use   Smoking status: Never   Smokeless tobacco: Never  Vaping Use   Vaping status: Never Used  Substance and Sexual Activity   Alcohol use: Not Currently   Drug use: Not Currently    Types: Marijuana    Comment: Experimented with   Sexual activity: Yes    Partners: Female  Other Topics Concern   Not on file  Social History Narrative   Not on file   Social Determinants of Health   Financial Resource Strain: Low Risk  (12/10/2021)   Overall Financial Resource Strain (CARDIA)    Difficulty of Paying Living Expenses: Not hard at all  Food Insecurity: No Food Insecurity (12/10/2021)   Hunger Vital Sign    Worried About Running Out of Food in the Last Year: Never true    Ran Out of Food in the Last Year: Never true  Transportation Needs: No Transportation Needs (12/10/2021)   PRAPARE - Administrator, Civil Service (Medical): No    Lack of Transportation (Non-Medical): No  Physical Activity: Inactive (12/10/2021)   Exercise Vital Sign    Days of Exercise per Week: 0 days    Minutes of Exercise per Session: 0 min  Stress: No Stress Concern Present (12/10/2021)   [employer] of Occupational Health - Occupational Stress Questionnaire    Feeling of Stress : Not at all  Social Connections: Moderately Integrated (12/10/2021)   Social Connection and Isolation Panel [NHANES]    Frequency of Communication with Friends and Family: More than three times a week    Frequency of Social Gatherings with Friends and Family: More than three times a week    Attends Religious Services: More than 4 times per year    Active Member of [employer] or Organizations: No    Attends Engineer, structural: Never    Marital Status: Married    Objective:   BP (!) 140/80   Pulse 86   Temp (!) 97.5 F (36.4 C)   Resp 14   Ht 5\' 7"  (1.702 m)   Wt 299 lb (135.6 kg)   SpO2 95%   BMI 46.83 kg/m      01/09/2023    1:30 PM 12/17/2022    8:43 AM 10/22/2022  8:41 AM  BP/Weight  Systolic BP 140 96 148  Diastolic BP 80 60 82  Wt. (Lbs) 299 298   BMI 46.83 kg/m2 46.67 kg/m2     Physical Exam Vitals reviewed.  Constitutional:      Appearance: Normal appearance.  Cardiovascular:     Rate and Rhythm: Normal rate and regular rhythm.     Heart sounds: Normal heart sounds.  Pulmonary:     Effort: Pulmonary effort is normal.     Breath sounds: Normal breath sounds.  Abdominal:     General: Bowel sounds are normal.     Palpations: Abdomen is soft.     Tenderness: There is no abdominal tenderness.  Neurological:     Mental Status: He is alert and oriented to person, place, and time.  Psychiatric:        Mood and Affect: Mood normal.        Behavior: Behavior normal.     Diabetic Foot Exam - Simple   No data filed      Lab Results  Component Value Date   WBC 9.6 10/16/2022   HGB 14.0 10/16/2022   HCT 41.4 10/16/2022   PLT 258 10/16/2022   GLUCOSE 84 10/16/2022   CHOL 168 12/17/2022   TRIG 90 12/17/2022   HDL 45 12/17/2022   LDLCALC 106 (H) 12/17/2022   ALT 18 10/16/2022   AST 17 10/16/2022   NA 139 10/16/2022   K 3.6 10/16/2022   CL 104 10/16/2022   CREATININE 0.85 10/16/2022   BUN 16 10/16/2022   CO2 28 10/16/2022   TSH 2.940 12/17/2022   HGBA1C 5.4 12/17/2022      Assessment & Plan:    Essential hypertension Assessment & Plan: Well controlled.  Continue to work on eating a healthy diet and exercise.  Labs drawn today.   No major side effects reported, and no issues with compliance. The current medical regimen is effective;  continue present plan with amlodipine 5mg  Will adjust medication as needed depending on labs BP Readings from Last 3 Encounters:  01/09/23 (!) 140/80  12/17/22 96/60  10/22/22 (!)  148/82     Orders: -     amLODIPine Besylate; Take 1 tablet (5 mg total) by mouth daily.  Dispense: 90 tablet; Refill: 0 -     Tirzepatide-Weight Management; Inject 2.5 mg into the skin once a week.  Dispense: 0.5 mL; Refill: 3  Sleep apnea, unspecified type Assessment & Plan: Sent for at home sleep study Will have follow up depending on results Will look into decreasing weight to help with treatment.  Orders: -     Home sleep test  Class 3 severe obesity due to excess calories with serious comorbidity and body mass index (BMI) of 45.0 to 49.9 in adult [facility]) Assessment & Plan: Prescribed Zepbound 2.5mg  Will only take zepbound for weight management Will have regular follow ups to adjust medication as needed  Orders: -     Tirzepatide-Weight Management; Inject 2.5 mg into the skin once a week.  Dispense: 0.5 mL; Refill: 3  BMI 45.0-49.9, adult (HCC) -     Tirzepatide-Weight Management; Inject 2.5 mg into the skin once a week.  Dispense: 0.5 mL; Refill: 3  History of fatty infiltration of liver Assessment & Plan: Currently not on any medications for treatment Wants to look into weight management to help along with diet and exercise.   Morbid obesity (HCC)     Meds ordered this encounter  Medications   amLODipine (  NORVASC) 5 MG tablet    Sig: Take 1 tablet (5 mg total) by mouth daily.    Dispense:  90 tablet    Refill:  0    PATIENT NEEDS TO CALL THE OFFICE TO SCHEDULE AN APPOINTMENT.   tirzepatide (ZEPBOUND) 2.5 MG/0.5ML Pen    Sig: Inject 2.5 mg into the skin once a week.    Dispense:  0.5 mL    Refill:  3    Orders Placed This Encounter  Procedures   Home sleep test     Follow-up: Return in about 2 months (around 03/11/2023) for Chronic, Huston Foley.   I,Marla I Leal-Borjas,acting as a scribe for [employer], PA.,have documented all relevant documentation on the behalf of Langley Gauss, PA,as directed by  Langley Gauss, PA while in the presence of Langley Gauss, [place].    An After Visit Summary was printed and given to the patient.  Langley Gauss, [place] [facility] [phone]

## 2023-01-09 NOTE — Assessment & Plan Note (Signed)
Currently not on any medications for treatment Wants to look into weight management to help along with diet and exercise.

## 2023-01-09 NOTE — Assessment & Plan Note (Signed)
Well controlled.  Continue to work on eating a healthy diet and exercise.  Labs drawn today.   No major side effects reported, and no issues with compliance. The current medical regimen is effective;  continue present plan with amlodipine 5mg  Will adjust medication as needed depending on labs BP Readings from Last 3 Encounters:  01/09/23 (!) 140/80  12/17/22 96/60  10/22/22 (!) 148/82

## 2023-01-09 NOTE — Assessment & Plan Note (Signed)
Prescribed Zepbound 2.5mg  Will only take zepbound for weight management Will have regular follow ups to adjust medication as needed

## 2023-01-15 ENCOUNTER — Inpatient Hospital Stay: Payer: No Typology Code available for payment source

## 2023-01-15 ENCOUNTER — Other Ambulatory Visit: Payer: Self-pay

## 2023-01-15 ENCOUNTER — Inpatient Hospital Stay: Payer: No Typology Code available for payment source | Attending: Hematology & Oncology

## 2023-01-15 DIAGNOSIS — Z79899 Other long term (current) drug therapy: Secondary | ICD-10-CM | POA: Diagnosis not present

## 2023-01-15 LAB — CBC WITH DIFFERENTIAL (CANCER CENTER ONLY)
Abs Immature Granulocytes: 0.03 10*3/uL (ref 0.00–0.07)
Basophils Absolute: 0 10*3/uL (ref 0.0–0.1)
Basophils Relative: 0 %
Eosinophils Absolute: 0.4 10*3/uL (ref 0.0–0.5)
Eosinophils Relative: 4 %
HCT: 44.7 % (ref 39.0–52.0)
Hemoglobin: 15 g/dL (ref 13.0–17.0)
Immature Granulocytes: 0 %
Lymphocytes Relative: 26 %
Lymphs Abs: 2.5 10*3/uL (ref 0.7–4.0)
MCH: 31.4 pg (ref 26.0–34.0)
MCHC: 33.6 g/dL (ref 30.0–36.0)
MCV: 93.5 fL (ref 80.0–100.0)
Monocytes Absolute: 0.6 10*3/uL (ref 0.1–1.0)
Monocytes Relative: 6 %
Neutro Abs: 6.2 10*3/uL (ref 1.7–7.7)
Neutrophils Relative %: 64 %
Platelet Count: 279 10*3/uL (ref 150–400)
RBC: 4.78 MIL/uL (ref 4.22–5.81)
RDW: 12.1 % (ref 11.5–15.5)
WBC Count: 9.8 10*3/uL (ref 4.0–10.5)
nRBC: 0 % (ref 0.0–0.2)

## 2023-01-15 LAB — CMP (CANCER CENTER ONLY)
ALT: 24 U/L (ref 0–44)
AST: 25 U/L (ref 15–41)
Albumin: 3.9 g/dL (ref 3.5–5.0)
Alkaline Phosphatase: 75 U/L (ref 38–126)
Anion gap: 9 (ref 5–15)
BUN: 16 mg/dL (ref 6–20)
CO2: 25 mmol/L (ref 22–32)
Calcium: 8.7 mg/dL — ABNORMAL LOW (ref 8.9–10.3)
Chloride: 102 mmol/L (ref 98–111)
Creatinine: 0.87 mg/dL (ref 0.61–1.24)
GFR, Estimated: >60 mL/min (ref >60)
Glucose, Bld: 125 mg/dL — ABNORMAL HIGH (ref 70–99)
Potassium: 3.9 mmol/L (ref 3.5–5.1)
Sodium: 136 mmol/L (ref 135–145)
Total Bilirubin: 0.7 mg/dL (ref <1.2)
Total Protein: 7.6 g/dL (ref 6.5–8.1)

## 2023-01-15 LAB — IRON AND IRON BINDING CAPACITY (CC-WL,HP ONLY)
Iron: 152 ug/dL (ref 45–182)
Saturation Ratios: 58 % — ABNORMAL HIGH (ref 17.9–39.5)
TIBC: 263 ug/dL (ref 250–450)
UIBC: 111 ug/dL — ABNORMAL LOW (ref 117–376)

## 2023-01-15 LAB — FERRITIN: Ferritin: 84 ng/mL (ref 24–336)

## 2023-01-15 MED ORDER — SODIUM CHLORIDE 0.9% FLUSH
10.0000 mL | INTRAVENOUS | Status: DC | PRN
  Administered 2023-01-15: 10 mL via INTRAVENOUS

## 2023-01-15 MED ORDER — HEPARIN SOD (PORK) LOCK FLUSH 100 UNIT/ML IV SOLN
500.0000 [IU] | Freq: Once | INTRAVENOUS | Status: AC
  Administered 2023-01-15: 500 [IU] via INTRAVENOUS

## 2023-01-15 NOTE — Progress Notes (Signed)
Unable to flush port-a-cath with NS,  Site is intact, able to feel dot's on surface of port.  Unable to push NS or get blood return from port. Port de accessed Appointment made for dye study at [facility] on Thursday at 200. Copy of schedule given to patient, patient verbalized understanding of instructions

## 2023-01-15 NOTE — Progress Notes (Signed)
Pt in for lab/port flush today and unable to get blood return or flush per John,RN.  Order placed for dye study for port-a-cath per MD. Appt made at [facility] 01/16/23 at 2pm. Pt aware.

## 2023-01-15 NOTE — Patient Instructions (Signed)

## 2023-01-16 ENCOUNTER — Other Ambulatory Visit (HOSPITAL_COMMUNITY): Payer: No Typology Code available for payment source

## 2023-01-16 ENCOUNTER — Telehealth: Payer: Self-pay

## 2023-01-16 ENCOUNTER — Inpatient Hospital Stay (HOSPITAL_COMMUNITY): Admission: RE | Admit: 2023-01-16 | Payer: No Typology Code available for payment source | Source: Ambulatory Visit

## 2023-01-16 ENCOUNTER — Telehealth: Payer: Self-pay | Admitting: Medical Oncology

## 2023-01-16 NOTE — Telephone Encounter (Addendum)
Spoke with pt to schedule phlebotomy. Pt did not wish to schedule at this time and stated they would call back for scheduling. Sent pt a reminder mychart msg at their request.   ----- Message from Rushie Chestnut sent at 01/16/2023  1:18 PM EST ----- Your iron saturation is above target. I would recommend a phlebotomy  Scheduling please schedule for a therapeutic phlebotomy

## 2023-01-16 NOTE — Telephone Encounter (Signed)
Prior auth done through [employer] and was approved.

## 2023-01-31 ENCOUNTER — Ambulatory Visit (HOSPITAL_COMMUNITY)
Admission: RE | Admit: 2023-01-31 | Discharge: 2023-01-31 | Disposition: A | Discharge status: Home / Self Care | Payer: No Typology Code available for payment source | Source: Ambulatory Visit | Attending: Hematology & Oncology | Admitting: Hematology & Oncology

## 2023-01-31 DIAGNOSIS — Z452 Encounter for adjustment and management of vascular access device: Secondary | ICD-10-CM | POA: Insufficient documentation

## 2023-01-31 HISTORY — PX: IR CV LINE INJECTION: IMG2294

## 2023-01-31 MED ORDER — IOHEXOL 300 MG/ML  SOLN: 50.0000 mL | Freq: Once | INTRAMUSCULAR | Status: DC | PRN

## 2023-02-05 ENCOUNTER — Telehealth: Payer: Self-pay | Admitting: Hematology & Oncology

## 2023-02-05 NOTE — Telephone Encounter (Addendum)
Called to see if pt was ready to schedule phleb. Unable to leave voice mail.   ----- Message from Rushie Chestnut sent at 01/16/2023  1:18 PM EST ----- Your iron saturation is above target. I would recommend a phlebotomy  Scheduling please schedule for a therapeutic phlebotomy

## 2023-02-11 ENCOUNTER — Telehealth: Payer: Self-pay

## 2023-02-11 NOTE — Telephone Encounter (Signed)
Called patient gave him [facility] number to call and see what's going on.

## 2023-02-11 NOTE — Telephone Encounter (Signed)
Copied from CRM [id number]. Topic: Clinical - Medication Question >> Feb 10, 2023  4:25 PM Mosetta Putt H wrote: Reason for CRM: havent received sleep kit

## 2023-02-28 ENCOUNTER — Other Ambulatory Visit: Payer: Self-pay

## 2023-02-28 MED ORDER — TIRZEPATIDE-WEIGHT MANAGEMENT 5 MG/0.5ML ~~LOC~~ SOLN: 5.0000 mg | SUBCUTANEOUS | 0 refills | Status: DC

## 2023-03-04 ENCOUNTER — Telehealth: Payer: Self-pay

## 2023-03-04 ENCOUNTER — Ambulatory Visit: Payer: Self-pay

## 2023-03-04 NOTE — Telephone Encounter (Signed)
 Copied from CRM [id number]. Topic: Clinical - Medication Refill >> Mar 04, 2023  9:56 AM Austin Gilbert wrote: Patient requesting the information regarding the medication zepbound 2.61ml but was supposed to get 5ml he thought by Dr. Andria Meuse. Sent message to clinic

## 2023-03-04 NOTE — Telephone Encounter (Signed)
Made patient aware zepbound 5 mg weekly was sent in on 02/28/23 [facility]  [facility] before going to pick medication up

## 2023-03-11 ENCOUNTER — Other Ambulatory Visit: Payer: Self-pay | Admitting: Physician Assistant

## 2023-03-11 ENCOUNTER — Telehealth: Payer: Self-pay

## 2023-03-11 DIAGNOSIS — Z6841 Body Mass Index (BMI) 40.0 and over, adult: Secondary | ICD-10-CM

## 2023-03-11 MED ORDER — TIRZEPATIDE-WEIGHT MANAGEMENT 5 MG/0.5ML ~~LOC~~ SOAJ: 5.0000 mg | SUBCUTANEOUS | 1 refills | Status: DC

## 2023-03-11 NOTE — Telephone Encounter (Signed)
 Copied from CRM [id number]. Topic: Clinical - Prescription Issue >> Mar 07, 2023  2:42 PM Mosetta Putt H wrote: Reason for CRM: insurance denied medication

## 2023-03-11 NOTE — Telephone Encounter (Signed)
 Spoke with patient wife she stated that patient insurance denied 0.5 mg because pharmacy charged the 0.25 mg and 0.5 mg at the same time, so it look like he was taking both dose together.   Made patient wife aware I will call pharmacy, called pharmacy and was told insurance needed prior authorization. Called patient insurance did prior Authorization over phone for 0.5 mg of zep-bound.  Approved 03/05/2023 - 11/06/2023

## 2023-03-11 NOTE — Telephone Encounter (Signed)
 Called patient and he stated that insurance did approved zep-bound and he recommend me call his wife and speak to her about what's going on with insurance.

## 2023-03-11 NOTE — Telephone Encounter (Signed)
 Copied from CRM [id number]. Topic: Clinical - Prescription Issue >> Mar 11, 2023  2:50 PM Desma Mcgregor wrote: Reason for CRM: Spouse Silva Bandy is calling back to speak with [place] about the Zepbound medication [phone]

## 2023-04-01 ENCOUNTER — Telehealth: Payer: Self-pay | Admitting: Physician Assistant

## 2023-04-01 NOTE — Telephone Encounter (Signed)
Copied from CRM [id number]. Topic: Clinical - Medication Question >> Apr 01, 2023 10:03 AM Alvino Blood C wrote: Reason for CRM: Patient hasn't meet his deductable and would like to know is there a simimalr medication to tirzepatide (ZEPBOUND) 5 MG/0.5ML Pen that can be prescribed at a lower cost.

## 2023-04-02 ENCOUNTER — Encounter: Payer: Self-pay | Admitting: Physician Assistant

## 2023-04-10 ENCOUNTER — Telehealth: Payer: Self-pay

## 2023-04-10 NOTE — Telephone Encounter (Signed)
 Equipment order cpap device

## 2023-04-14 ENCOUNTER — Ambulatory Visit: Payer: Self-pay | Admitting: Physician Assistant

## 2023-04-14 NOTE — Telephone Encounter (Signed)
  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 Patient denies pain or bleeding, reports tetanus is UTD. Per protocol, patient to present to ED now for evaluation based of symptoms. Care advice reviewed, patient verbalized understanding and denies further questions at this time.  Alerting PCP for review.     Copied from CRM [id number]. 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 Wanting to know if he needs to do anything else about this. Reason for Disposition  [1] Any break in skin from BITE (e.g., cut, puncture or scratch) AND[2] WILD animal at risk for RABIES (e.g., bat, raccoon, fox, skunk, coyote, other carnivores; see Background for list.)  Answer Assessment - Initial Assessment Questions 1. ANIMAL: "What type of animal caused the bite?" "Is the injury from a bite or a claw?" If the animal is a dog or a cat, ask: "Was it a pet or a stray?" "Was it acting ill or behaving strangely?"     Mouse 2. LOCATION: "Where is the bite located?"      Index finger on L hand 3. SIZE: "How big is the bite?" "What does it look like?"      One small puncture wound,  palm side of hand 4. ONSET: "When did the bite happen?" (Minutes or hours ago)      0700 5. 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 6. TETANUS: "When was your last tetanus booster?"     UTD 7. RABIES  VACCINE: For dog or cat bites, ask: "Do you know if the pet is vaccinated against rabies?"  (e.g., yes, no, overdue for rabies shot, unknown)     NA  Protocols used: Animal Bite-A-AH

## 2023-04-15 ENCOUNTER — Inpatient Hospital Stay: Payer: No Typology Code available for payment source

## 2023-04-15 ENCOUNTER — Inpatient Hospital Stay: Payer: No Typology Code available for payment source | Attending: Hematology & Oncology | Admitting: Family

## 2023-04-15 ENCOUNTER — Inpatient Hospital Stay: Payer: No Typology Code available for payment source | Admitting: Family

## 2023-04-15 ENCOUNTER — Encounter: Payer: Self-pay | Admitting: Family

## 2023-04-15 DIAGNOSIS — Z79899 Other long term (current) drug therapy: Secondary | ICD-10-CM | POA: Insufficient documentation

## 2023-04-15 DIAGNOSIS — Z95828 Presence of other vascular implants and grafts: Secondary | ICD-10-CM

## 2023-04-15 LAB — CBC WITH DIFFERENTIAL (CANCER CENTER ONLY)
Abs Immature Granulocytes: 0.04 10*3/uL (ref 0.00–0.07)
Basophils Absolute: 0 10*3/uL (ref 0.0–0.1)
Basophils Relative: 0 %
Eosinophils Absolute: 0.3 10*3/uL (ref 0.0–0.5)
Eosinophils Relative: 3 %
HCT: 41.5 % (ref 39.0–52.0)
Hemoglobin: 14.2 g/dL (ref 13.0–17.0)
Immature Granulocytes: 0 %
Lymphocytes Relative: 24 %
Lymphs Abs: 2.9 10*3/uL (ref 0.7–4.0)
MCH: 31.3 pg (ref 26.0–34.0)
MCHC: 34.2 g/dL (ref 30.0–36.0)
MCV: 91.6 fL (ref 80.0–100.0)
Monocytes Absolute: 0.9 10*3/uL (ref 0.1–1.0)
Monocytes Relative: 7 %
Neutro Abs: 7.8 10*3/uL — ABNORMAL HIGH (ref 1.7–7.7)
Neutrophils Relative %: 66 %
Platelet Count: 274 10*3/uL (ref 150–400)
RBC: 4.53 MIL/uL (ref 4.22–5.81)
RDW: 12.7 % (ref 11.5–15.5)
WBC Count: 11.9 10*3/uL — ABNORMAL HIGH (ref 4.0–10.5)
nRBC: 0 % (ref 0.0–0.2)

## 2023-04-15 LAB — CMP (CANCER CENTER ONLY)
ALT: 25 U/L (ref 0–44)
AST: 22 U/L (ref 15–41)
Albumin: 4 g/dL (ref 3.5–5.0)
Alkaline Phosphatase: 66 U/L (ref 38–126)
Anion gap: 10 (ref 5–15)
BUN: 14 mg/dL (ref 6–20)
CO2: 23 mmol/L (ref 22–32)
Calcium: 8.9 mg/dL (ref 8.9–10.3)
Chloride: 102 mmol/L (ref 98–111)
Creatinine: 0.81 mg/dL (ref 0.61–1.24)
GFR, Estimated: >60 mL/min (ref >60)
Glucose, Bld: 85 mg/dL (ref 70–99)
Potassium: 3.7 mmol/L (ref 3.5–5.1)
Sodium: 135 mmol/L (ref 135–145)
Total Bilirubin: 0.3 mg/dL (ref 0.0–1.2)
Total Protein: 7.1 g/dL (ref 6.5–8.1)

## 2023-04-15 LAB — FERRITIN: Ferritin: 104 ng/mL (ref 24–336)

## 2023-04-15 MED ORDER — HEPARIN SOD (PORK) LOCK FLUSH 100 UNIT/ML IV SOLN
500.0000 [IU] | Freq: Once | INTRAVENOUS | Status: AC
  Administered 2023-04-15: 500 [IU] via INTRAVENOUS

## 2023-04-15 MED ORDER — SODIUM CHLORIDE 0.9% FLUSH
10.0000 mL | Freq: Once | INTRAVENOUS | Status: AC
  Administered 2023-04-15: 10 mL via INTRAVENOUS

## 2023-04-15 NOTE — Patient Instructions (Signed)

## 2023-04-15 NOTE — Progress Notes (Signed)
Patient came in today for labs and see provider. Patient had the port-a-cath removed from the right chest and a new one placed on the left chest. Report from [facility] on 02/21/2023 states,"the left internal jugular port-a-cath tip lies in the expected cavoatrial junction/upper right atrium. "Chest X-ray report has been scanned into his chart. Also, I documented on the IV assessment flowsheet .

## 2023-04-15 NOTE — Progress Notes (Signed)
Hematology and Oncology Follow Up Visit  Austin Gilbert [id number] December 12, 1975 48 y.o. 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 He had his port a cath replaced in December at [facility]. Austin Gilbert confirmed satisfactory placement.  Port flushed and labs pending.  No issues with fever, chills, n/v, headaches, hot flashes/night sweats, changes in/loss of vision, cough, rash, dizziness, SOB, chest pain, palpitations, abdominal pain/bloating or changes in bowel or bladder habits.  No swelling, tenderness, numbness or tingling in his extremities at this time.  No falls or syncope.  Appetite and hydration are good. Weight is stable at 287 lbs.   ECOG Performance Status: 0 - Asymptomatic  Medications:  Allergies as of 04/15/2023   No Known Allergies      Medication List        Accurate as of April 15, 2023  1:58 PM. If you have any questions, ask your nurse or doctor.          amLODipine 5 MG tablet Commonly known as: NORVASC Take 1 tablet (5 mg total) by mouth daily.   multivitamin tablet Take 1 tablet by mouth daily.   tirzepatide 5 MG/0.5ML Pen Commonly known as: ZEPBOUND Inject 5 mg into the skin once a week.        Allergies: No Known Allergies  Past Medical History, Surgical history, Social history, and Family History were reviewed and updated.  Review of Systems: All other 10 point review of systems is negative.   Physical Exam:  height is 5\' 7"  (1.702 m) and weight is 287 lb 12.8 oz (130.5 kg). His oral temperature is 97.6 F (36.4 C). His blood pressure is 125/75 and his pulse is 86. His respiration is 18 and oxygen saturation is 99%.   Wt Readings from Last 3 Encounters:  04/15/23 287 lb 12.8 oz (130.5 kg)  01/09/23 299 lb (135.6 kg)   12/17/22 298 lb (135.2 kg)    Ocular: Sclerae unicteric, pupils equal, round and reactive to light Ear-nose-throat: Oropharynx clear, dentition fair Lymphatic: No cervical or supraclavicular adenopathy Lungs no rales or rhonchi, good excursion bilaterally Heart regular rate and rhythm, no murmur appreciated Abd soft, nontender, positive bowel sounds MSK no focal spinal tenderness, no joint edema Neuro: non-focal, well-oriented, appropriate affect Breasts: Deferred   Lab Results  Component Value Date   WBC 9.8 01/15/2023   HGB 15.0 01/15/2023   HCT 44.7 01/15/2023   MCV 93.5 01/15/2023   PLT 279 01/15/2023   Lab Results  Component Value Date   FERRITIN 84 01/15/2023   IRON 152 01/15/2023   TIBC 263 01/15/2023   UIBC 111 (L) 01/15/2023   IRONPCTSAT 58 (H) 01/15/2023   Lab Results  Component Value Date   RBC 4.78 01/15/2023   No results found for: "KPAFRELGTCHN", "LAMBDASER", "KAPLAMBRATIO" No results found for: "IGGSERUM", "IGA", "IGMSERUM" No results found for: "TOTALPROTELP", "ALBUMINELP", "A1GS", "A2GS", "BETS", "BETA2SER", "GAMS", "MSPIKE", "SPEI"   Chemistry      Component Value Date/Time   NA 136 01/15/2023 1147   NA 137 12/10/2021 0927   K 3.9 01/15/2023 1147   CL 102 01/15/2023 1147   CO2 25 01/15/2023 1147   BUN 16 01/15/2023 1147   BUN 13 12/10/2021 0927   CREATININE 0.87 01/15/2023 1147  GLU 94 08/03/2021 0000      Component Value Date/Time   CALCIUM 8.7 (L) 01/15/2023 1147   ALKPHOS 75 01/15/2023 1147   AST 25 01/15/2023 1147   ALT 24 01/15/2023 1147   BILITOT 0.7 01/15/2023 1147       Impression and Plan:  Austin Gilbert is a very pleasant 48 yo caucasian gentleman with long history of hemochromatosis.  Iron studies are pending. We will set him up for phlebotomy if needed.  Port flush every 2 months, lab check every 4 months and follow-up in 8 months.   Eileen Stanford, NP 2/11/20251:58 PM

## 2023-04-16 ENCOUNTER — Encounter: Payer: Self-pay | Admitting: *Deleted

## 2023-04-16 LAB — IRON AND IRON BINDING CAPACITY (CC-WL,HP ONLY)
Iron: 89 ug/dL (ref 45–182)
Saturation Ratios: 34 % (ref 17.9–39.5)
TIBC: 262 ug/dL (ref 250–450)
UIBC: 173 ug/dL (ref 117–376)

## 2023-05-19 ENCOUNTER — Other Ambulatory Visit: Payer: Self-pay | Admitting: Physician Assistant

## 2023-05-19 DIAGNOSIS — Z6841 Body Mass Index (BMI) 40.0 and over, adult: Secondary | ICD-10-CM

## 2023-05-19 DIAGNOSIS — E66813 Obesity, class 3: Secondary | ICD-10-CM

## 2023-05-19 MED ORDER — TIRZEPATIDE-WEIGHT MANAGEMENT 5 MG/0.5ML ~~LOC~~ SOAJ: 5.0000 mg | SUBCUTANEOUS | 1 refills | Status: DC

## 2023-05-19 NOTE — Telephone Encounter (Signed)
 Copied from CRM [id number]. Topic: Clinical - Medication Refill >> May 19, 2023  9:03 AM Franchot Heidelberg wrote: Most Recent Primary Care Visit:  Provider: Langley Gauss  Department: [facility]  Visit Type: OFFICE VISIT  Date: 01/09/2023  Medication: Wants to resume Zepbound   tirzepatide (ZEPBOUND) 2.5 MG/0.5ML Pen  Has the patient contacted their pharmacy? No (Agent: If no, request that the patient contact the pharmacy for the refill. If patient does not wish to contact the pharmacy document the reason why and proceed with request.) (Agent: If yes, when and what did the pharmacy advise?)  Is this the correct pharmacy for this prescription? No If no, delete pharmacy and type the correct one.  This is the patient's preferred pharmacy:  [facility] [address] [place] - [address] [place] [address] Phone: [phone] Fax: [phone]   Has the prescription been filled recently? No  Is the patient out of the medication? Yes  Has the patient been seen for an appointment in the last year OR does the patient have an upcoming appointment? Yes  Can we respond through MyChart? Yes  Agent: Please be advised that Rx refills may take up to 3 business days. We ask that you follow-up with your pharmacy.

## 2023-06-04 ENCOUNTER — Ambulatory Visit: Admitting: Physician Assistant

## 2023-06-04 NOTE — Progress Notes (Unsigned)
 Subjective:  Patient ID: Austin Gilbert, male    DOB: May 22, 1975  Age: 48 y.o. MRN: [id number]  No chief complaint on file.   Discussed the use of AI scribe software for clinical note transcription with the patient, who gave verbal consent to proceed.         12/17/2022    9:53 AM 12/10/2021    8:57 AM 09/24/2019    7:44 AM  Depression screen PHQ 2/9  Decreased Interest 1 0 0  Down, Depressed, Hopeless 1 0 0  PHQ - 2 Score 2 0 0  Altered sleeping 3 0   Tired, decreased energy 3 0   Change in appetite 3 0   Feeling bad or failure about yourself  1 0   Trouble concentrating 1 0   Moving slowly or fidgety/restless 0 0   Suicidal thoughts 0 0   PHQ-9 Score 13 0   Difficult doing work/chores Extremely dIfficult Not difficult at all         12/17/2022    9:53 AM  Fall Risk   Falls in the past year? 1  Number falls in past yr: 0  Injury with Fall? 0  Risk for fall due to : Impaired mobility  Follow up Education provided    Patient Care Team: Langley Gauss, [place] as PCP - General (Physician Assistant) Erenest Blank, NP as Nurse Practitioner (Nurse Practitioner)   Review of Systems  Constitutional:  Negative for appetite change, fatigue and fever.  HENT:  Negative for congestion, ear pain, sinus pressure and sore throat.   Respiratory:  Negative for cough, chest tightness, shortness of breath and wheezing.   Cardiovascular:  Negative for chest pain and palpitations.  Gastrointestinal:  Negative for abdominal pain, constipation, diarrhea, nausea and vomiting.  Genitourinary:  Negative for dysuria and hematuria.  Musculoskeletal:  Negative for arthralgias, back pain, joint swelling and myalgias.  Skin:  Negative for rash.  Neurological:  Negative for dizziness, weakness and headaches.  Psychiatric/Behavioral:  Negative for dysphoric mood. The patient is not nervous/anxious.     Current Outpatient Medications on File Prior to Visit  Medication Sig Dispense Refill    amLODipine (NORVASC) 5 MG tablet Take 1 tablet (5 mg total) by mouth daily. 90 tablet 0   Multiple Vitamin (MULTIVITAMIN) tablet Take 1 tablet by mouth daily.     tirzepatide (ZEPBOUND) 5 MG/0.5ML Pen Inject 5 mg into the skin once a week. 2 mL 1   Current Facility-Administered Medications on File Prior to Visit  Medication Dose Route Frequency Provider Last Rate Last Admin   alteplase (CATHFLO ACTIVASE) injection 2 mg  2 mg Intracatheter Once Weston Settle, MD       Past Medical History:  Diagnosis Date   COVID-19 03/2020   Epilepsy [facility])    Hemochromatosis    Hypertension    Other hemochromatosis    Sleep apnea    Past Surgical History:  Procedure Laterality Date   IR CV LINE INJECTION  01/31/2023   PORT A CATH INJECTION ([facility] HX) Right 07/01/2019   TONSILLECTOMY      Family History  Problem Relation Age of Onset   Heart disease Mother    Hypertension Mother    Hyperlipidemia Mother    Transient ischemic attack Mother    COPD Father    Testicular cancer Brother    Colon cancer Neg Hx    Colon polyps Neg Hx    Esophageal cancer Neg Hx    Rectal  cancer Neg Hx    Stomach cancer Neg Hx    Social History   Socioeconomic History   Marital status: Married    Spouse name: Not on file   Number of children: Not on file   Years of education: Not on file   Highest education level: Not on file  Occupational History    Employer: [employer]  Tobacco Use   Smoking status: Never   Smokeless tobacco: Never  Vaping Use   Vaping status: Never Used  Substance and Sexual Activity   Alcohol use: Not Currently   Drug use: Not Currently    Types: Marijuana    Comment: Experimented with   Sexual activity: Yes    Partners: Female  Other Topics Concern   Not on file  Social History Narrative   Not on file   Social Drivers of Health   Financial Resource Strain: Low Risk  (12/10/2021)   Overall Financial Resource Strain (CARDIA)    Difficulty of Paying Living  Expenses: Not hard at all  Food Insecurity: No Food Insecurity (12/10/2021)   Hunger Vital Sign    Worried About Running Out of Food in the Last Year: Never true    Ran Out of Food in the Last Year: Never true  Transportation Needs: No Transportation Needs (12/10/2021)   PRAPARE - Administrator, Civil Service (Medical): No    Lack of Transportation (Non-Medical): No  Physical Activity: Inactive (12/10/2021)   Exercise Vital Sign    Days of Exercise per Week: 0 days    Minutes of Exercise per Session: 0 min  Stress: No Stress Concern Present (12/10/2021)   [employer] of Occupational Health - Occupational Stress Questionnaire    Feeling of Stress : Not at all  Social Connections: Moderately Integrated (12/10/2021)   Social Connection and Isolation Panel [NHANES]    Frequency of Communication with Friends and Family: More than three times a week    Frequency of Social Gatherings with Friends and Family: More than three times a week    Attends Religious Services: More than 4 times per year    Active Member of [employer] or Organizations: No    Attends Banker Meetings: Never    Marital Status: Married    Objective:  There were no vitals taken for this visit.     04/15/2023    1:55 PM 01/15/2023   10:42 AM 01/09/2023    1:30 PM  BP/Weight  Systolic BP 125 130 140  Diastolic BP 75 81 80  Wt. (Lbs) 287.8  299  BMI 45.08 kg/m2  46.83 kg/m2    Physical Exam  Diabetic Foot Exam - Simple   No data filed      Lab Results  Component Value Date   WBC 11.9 (H) 04/15/2023   HGB 14.2 04/15/2023   HCT 41.5 04/15/2023   PLT 274 04/15/2023   GLUCOSE 85 04/15/2023   CHOL 168 12/17/2022   TRIG 90 12/17/2022   HDL 45 12/17/2022   LDLCALC 106 (H) 12/17/2022   ALT 25 04/15/2023   AST 22 04/15/2023   NA 135 04/15/2023   K 3.7 04/15/2023   CL 102 04/15/2023   CREATININE 0.81 04/15/2023   BUN 14 04/15/2023   CO2 23 04/15/2023   TSH 2.940 12/17/2022   HGBA1C  5.4 12/17/2022      Assessment & Plan:  Assessment and Plan       There are no diagnoses linked to this  encounter.   No orders of the defined types were placed in this encounter.   No orders of the defined types were placed in this encounter.    Follow-up: No follow-ups on file.   I,Raquel Racey M Kerie Badger,acting as a Neurosurgeon for [employer], PA.,have documented all relevant documentation on the behalf of Langley Gauss, PA,as directed by  Langley Gauss, PA while in the presence of Langley Gauss, [place].   An After Visit Summary was printed and given to the patient.  Langley Gauss, [place] [facility] [phone]

## 2023-06-05 ENCOUNTER — Ambulatory Visit (INDEPENDENT_AMBULATORY_CARE_PROVIDER_SITE_OTHER): Admitting: Physician Assistant

## 2023-06-05 ENCOUNTER — Encounter: Payer: Self-pay | Admitting: Physician Assistant

## 2023-06-05 VITALS — BP 122/78 | HR 78 | Temp 97.8°F | Ht 67.0 in | Wt 284.0 lb

## 2023-06-05 DIAGNOSIS — Z8719 Personal history of other diseases of the digestive system: Secondary | ICD-10-CM

## 2023-06-05 DIAGNOSIS — D229 Melanocytic nevi, unspecified: Secondary | ICD-10-CM

## 2023-06-05 DIAGNOSIS — I10 Essential (primary) hypertension: Secondary | ICD-10-CM | POA: Diagnosis not present

## 2023-06-05 DIAGNOSIS — G4730 Sleep apnea, unspecified: Secondary | ICD-10-CM | POA: Diagnosis not present

## 2023-06-05 HISTORY — DX: Melanocytic nevi, unspecified: D22.9

## 2023-06-05 NOTE — Assessment & Plan Note (Signed)
 Blood pressure 122/78 mmHg, within normal range. Amlodipine held. Improved sleep apnea management may contribute to blood pressure control. - Hold amlodipine. - Monitor blood pressure at home twice a week. - Resume amlodipine if blood pressure exceeds 140/90 mmHg.

## 2023-06-05 NOTE — Assessment & Plan Note (Signed)
 Continue to monitor with diet and exercise Will adjust treatment depending on symptoms

## 2023-06-05 NOTE — Assessment & Plan Note (Signed)
 Mole with asymmetry, irregular borders, multiple colors, and burning sensation. Possible skin cancer. Urgent dermatology referral needed. - Refer to dermatology for evaluation and possible biopsy. - Send picture of mole to dermatology. - Discuss potential need for Mohs procedure depending on pathology results.

## 2023-06-05 NOTE — Assessment & Plan Note (Signed)
 Unable to afford Zepbound until deductible met Will begin treatment once able to afford medicine Continue to monitor with diet and exercise. Will follow up if there is still issues

## 2023-06-05 NOTE — Assessment & Plan Note (Signed)
 Managed with CPAP. Improved sleep quality with new machine. May contribute to blood pressure control.

## 2023-06-05 NOTE — Patient Instructions (Signed)
 VISIT SUMMARY:  During your visit today, we discussed several health concerns including your Crohn's disease, a concerning mole on your back, your blood pressure, and your sleep apnea. We also reviewed your general health maintenance and routine blood work results.  YOUR PLAN:  -CONCERNING MOLE: The mole on your back has irregular features and symptoms that could indicate skin cancer. We will refer you to a dermatologist for further evaluation and possibly a biopsy. A picture of the mole will be sent to the dermatologist, and depending on the results, a procedure called Mohs surgery may be needed.  -CROHN'S DISEASE: Crohn's disease is a chronic inflammatory condition of the digestive tract. You have not started your prescribed medication, Zepbound, due to insurance issues, but have managed your symptoms through dietary changes and weight loss. We will discuss starting Zepbound once your insurance deductible is met.  -HYPERTENSION: Hypertension, or high blood pressure, is being managed well currently. Your blood pressure reading today was normal, so you can hold off on taking amlodipine for now. Please monitor your blood pressure at home twice a week and resume the medication if your readings go above 140/90 mmHg.  -OBSTRUCTIVE SLEEP APNEA: Obstructive sleep apnea is a condition where your breathing stops and starts during sleep. You have been using a CPAP machine, which has improved your sleep quality and may also be helping to control your blood pressure.  -GENERAL HEALTH MAINTENANCE: Your routine blood work is within acceptable ranges, though your cholesterol is slightly elevated. We will plan to do blood work again in six months to monitor your A1c and cholesterol levels.  INSTRUCTIONS:  Please follow up with the dermatologist as soon as possible for the evaluation of your mole. Continue to monitor your blood pressure at home twice a week and resume amlodipine if it exceeds 140/90 mmHg. We will  plan to do blood work in six months to check your A1c and cholesterol levels.

## 2023-06-17 ENCOUNTER — Inpatient Hospital Stay: Payer: No Typology Code available for payment source

## 2023-07-03 ENCOUNTER — Inpatient Hospital Stay: Attending: Hematology & Oncology

## 2023-07-03 DIAGNOSIS — Z95828 Presence of other vascular implants and grafts: Secondary | ICD-10-CM

## 2023-07-03 DIAGNOSIS — Z79899 Other long term (current) drug therapy: Secondary | ICD-10-CM | POA: Diagnosis not present

## 2023-07-03 MED ORDER — SODIUM CHLORIDE 0.9% FLUSH
10.0000 mL | Freq: Once | INTRAVENOUS | Status: AC
Start: 2023-07-03 — End: 2023-07-03
  Administered 2023-07-03: 10 mL via INTRAVENOUS

## 2023-07-03 MED ORDER — HEPARIN SOD (PORK) LOCK FLUSH 100 UNIT/ML IV SOLN
500.0000 [IU] | Freq: Once | INTRAVENOUS | Status: AC
  Administered 2023-07-03: 500 [IU] via INTRAVENOUS

## 2023-07-03 NOTE — Patient Instructions (Signed)

## 2023-07-30 ENCOUNTER — Other Ambulatory Visit: Payer: Self-pay

## 2023-07-30 DIAGNOSIS — I10 Essential (primary) hypertension: Secondary | ICD-10-CM | POA: Insufficient documentation

## 2023-07-31 ENCOUNTER — Encounter: Payer: Self-pay | Admitting: Cardiology

## 2023-07-31 ENCOUNTER — Ambulatory Visit: Attending: Cardiology | Admitting: Cardiology

## 2023-07-31 VITALS — BP 120/88 | HR 73 | Ht 67.0 in | Wt 281.0 lb

## 2023-07-31 DIAGNOSIS — E66813 Obesity, class 3: Secondary | ICD-10-CM | POA: Diagnosis not present

## 2023-07-31 DIAGNOSIS — I10 Essential (primary) hypertension: Secondary | ICD-10-CM | POA: Diagnosis not present

## 2023-07-31 DIAGNOSIS — Z6841 Body Mass Index (BMI) 40.0 and over, adult: Secondary | ICD-10-CM

## 2023-07-31 DIAGNOSIS — G4733 Obstructive sleep apnea (adult) (pediatric): Secondary | ICD-10-CM | POA: Diagnosis not present

## 2023-07-31 NOTE — Patient Instructions (Signed)
 Medication Instructions:  Your physician recommends that you continue on your current medications as directed. Please refer to the Current Medication list given to you today.  *If you need a refill on your cardiac medications before your next appointment, please call your pharmacy*  Lab Work: None If you have labs (blood work) drawn today and your tests are completely normal, you will receive your results only by: MyChart Message (if you have MyChart) OR A paper copy in the mail If you have any lab test that is abnormal or we need to change your treatment, we will call you to review the results.  Testing/Procedures: Your physician has requested that you have an echocardiogram. Echocardiography is a painless test that uses sound waves to create images of your heart. It provides your doctor with information about the size and shape of your heart and how well your heart's chambers and valves are working. This procedure takes approximately one hour. There are no restrictions for this procedure. Please do NOT wear cologne, perfume, aftershave, or lotions (deodorant is allowed). Please arrive 15 minutes prior to your appointment time.  Please note: We ask at that you not bring children with you during ultrasound (echo/ vascular) testing. Due to room size and safety concerns, children are not allowed in the ultrasound rooms during exams. Our front office staff cannot provide observation of children in our lobby area while testing is being conducted. An adult accompanying a patient to their appointment will only be allowed in the ultrasound room at the discretion of the ultrasound technician under special circumstances. We apologize for any inconvenience.   Follow-Up: At [facility], you and your health needs are our priority.  As part of our continuing mission to provide you with exceptional heart care, our providers are all part of one team.  This team includes your primary Cardiologist  (physician) and Advanced Practice Providers or APPs (Physician Assistants and Nurse Practitioners) who all work together to provide you with the care you need, when you need it.  Your next appointment:   1 year(s)  Provider:   Ralene Burger, MD    We recommend signing up for the patient portal called "MyChart".  Sign up information is provided on this After Visit Summary.  MyChart is used to connect with patients for Virtual Visits (Telemedicine).  Patients are able to view lab/test results, encounter notes, upcoming appointments, etc.  Non-urgent messages can be sent to your provider as well.   To learn more about what you can do with MyChart, go to [email or web address].   Other Instructions None

## 2023-07-31 NOTE — Progress Notes (Signed)
 Cardiology Office Note:    Date:  07/31/2023   ID:  Austin Gilbert, DOB 11/30/1975, MRN [id number]  PCP:  Odilia Bennett, PA  Cardiologist:  Ralene Burger, MD    Referring MD: Odilia Bennett, [place]   Chief Complaint  Patient presents with   Annual Exam    History of Present Illness:    Austin Gilbert is a 48 y.o. male past medical history significant for obstructive sleep apnea is on CPAP mask, morbid obesity, essential hypertension, hereditary hemochromatosis.  Comes today to months for follow-up for his doing great.  Asymptomatic denies have any chest pain tightness squeezing pressure burning chest no palpitations dizziness swelling of lower extremities.  Past Medical History:  Diagnosis Date   Acute internal derangement of left knee 04/10/2020   Atypical mole 06/05/2023   BMI 45.0-49.9, adult (HCC) 06/08/2019   Chest pain 04/13/2019   Chronic pain of right hip 12/17/2022   Class 3 severe obesity due to excess calories with serious comorbidity and body mass index (BMI) of 45.0 to 49.9 in adult 12/17/2022   COVID-19 03/2020   Dizziness 04/13/2019   DOE (dyspnea on exertion) 04/16/2019   Epilepsy (HCC)    Essential hypertension 04/16/2019   Fatigue 06/24/2019   Hemochromatosis    Hereditary hemochromatosis (HCC) 12/02/2019   History of fatty infiltration of liver 01/09/2023   Hypertension    Hypogonadism in male 06/24/2019   Morbid obesity (HCC) 04/13/2019   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   Multiple Vitamin (MULTIVITAMIN) tablet Take 1 tablet by mouth daily.     Allergies:    Patient has no known allergies.   Social History   Socioeconomic History   Marital status: Married    Spouse name: Not on file   Number of children: Not on file   Years of education: Not on file   Highest education level: Not on file  Occupational History    Employer: [employer]  Tobacco Use   Smoking status: Never   Smokeless tobacco: Never  Vaping Use   Vaping status: Never Used  Substance and Sexual Activity   Alcohol use: Not Currently   Drug use: Not Currently    Types: Marijuana    Comment: Experimented with   Sexual activity: Yes    Partners: Female  Other Topics Concern   Not on file  Social History Narrative   Not on file   Social Drivers of Health   Financial Resource Strain: Low Risk  (06/05/2023)   Overall Financial Resource Strain (CARDIA)    Difficulty of Paying Living Expenses: Not hard at all  Food Insecurity: No Food Insecurity (06/05/2023)   Hunger Vital Sign    Worried About Running Out of Food in the Last Year: Never true    Ran Out of Food in the Last Year: Never true  Transportation Needs: No Transportation Needs (06/05/2023)   PRAPARE - Administrator, Civil Service (Medical): No    Lack of Transportation (Non-Medical): No  Physical Activity: Inactive (12/10/2021)   Exercise Vital  Sign    Days of Exercise per Week: 0 days    Minutes of Exercise per Session: 0 min  Stress: No Stress Concern Present (06/05/2023)   [employer] of Occupational Health - Occupational Stress Questionnaire    Feeling of Stress : Not at all  Social Connections: Moderately Integrated (06/05/2023)   Social Connection and Isolation Panel [NHANES]    Frequency of Communication with Friends and Family: More than three times a week    Frequency of Social Gatherings with Friends and Family: More than three times a week    Attends Religious Services: More than 4 times per year    Active Member of [employer] or Organizations: No    Attends Hospital doctor: Never    Marital Status: Married     Family History: The patient's family history includes COPD in his father; Heart disease in his mother; Hyperlipidemia in his mother; Hypertension in his mother; Testicular cancer in his brother; Transient ischemic attack in his mother. There is no history of Colon cancer, Colon polyps, Esophageal cancer, Rectal cancer, or Stomach cancer. ROS:   Please see the history of present illness.    All 14 point review of systems negative except as described per history of present illness  EKGs/Labs/Other Studies Reviewed:         Recent Labs: 12/17/2022: TSH 2.940 04/15/2023: ALT 25; BUN 14; Creatinine 0.81; Hemoglobin 14.2; Platelet Count 274; Potassium 3.7; Sodium 135  Recent Lipid Panel    Component Value Date/Time   CHOL 168 12/17/2022 0941   TRIG 90 12/17/2022 0941   HDL 45 12/17/2022 0941   CHOLHDL 3.7 12/17/2022 0941   LDLCALC 106 (H) 12/17/2022 0941    Physical Exam:    VS:  BP 120/88   Pulse 73   Ht 5\' 7"  (1.702 m)   Wt 281 lb (127.5 kg)   SpO2 97%   BMI 44.01 kg/m     Wt Readings from Last 3 Encounters:  07/31/23 281 lb (127.5 kg)  06/05/23 284 lb (128.8 kg)  04/15/23 287 lb 12.8 oz (130.5 kg)     GEN:  Well nourished, well developed in no acute distress HEENT: Normal NECK: No JVD; No carotid bruits LYMPHATICS: No lymphadenopathy CARDIAC: RRR, no murmurs, no rubs, no gallops RESPIRATORY:  Clear to auscultation without rales, wheezing or rhonchi  ABDOMEN: Soft, non-tender, non-distended MUSCULOSKELETAL:  No edema; No deformity  SKIN: Warm and dry LOWER EXTREMITIES: no swelling NEUROLOGIC:  Alert and oriented x 3 PSYCHIATRIC:  Normal affect   ASSESSMENT:    1. Essential hypertension   2. Obstructive sleep apnea syndrome   3. Class 3 severe obesity due to excess calories with serious comorbidity and body mass index (BMI) of 45.0 to 49.9 in adult   4. Hereditary hemochromatosis (HCC)    PLAN:    In order of  problems listed above:  Essential hypertension blood pressure well-controlled advised him to continue present medications. Obstructive sleep apnea: On CPAP mask which we will continue. Obesity obviously a problem but he is working on weight loss and he is succeeding. Hereditary hemochromatosis no cardiac manifestations so far will get echocardiogram make sure that wall thickness is normal.  He does not have any palpitations there is no dizziness no passing out.  I warned him if he develop any of the symptoms he will need to let me know.   Medication Adjustments/Labs and Tests Ordered: Current medicines are reviewed at length with the patient today.  Concerns  regarding medicines are outlined above.  Orders Placed This Encounter  Procedures   EKG 12-Lead   Medication changes: No orders of the defined types were placed in this encounter.   Signed, Manfred Seed, MD, [facility] 07/31/2023 8:49 AM    [facility]

## 2023-07-31 NOTE — Addendum Note (Signed)
 Addended by: Aurelio Leer I on: 07/31/2023 08:57 AM   Modules accepted: Orders

## 2023-08-05 ENCOUNTER — Ambulatory Visit (INDEPENDENT_AMBULATORY_CARE_PROVIDER_SITE_OTHER): Admitting: Physician Assistant

## 2023-08-05 ENCOUNTER — Encounter: Payer: Self-pay | Admitting: Physician Assistant

## 2023-08-05 ENCOUNTER — Telehealth: Payer: Self-pay

## 2023-08-05 VITALS — BP 110/62 | HR 73 | Temp 97.6°F | Ht 67.0 in | Wt 282.0 lb

## 2023-08-05 DIAGNOSIS — G40A09 Absence epileptic syndrome, not intractable, without status epilepticus: Secondary | ICD-10-CM

## 2023-08-05 DIAGNOSIS — G4733 Obstructive sleep apnea (adult) (pediatric): Secondary | ICD-10-CM

## 2023-08-05 DIAGNOSIS — I10 Essential (primary) hypertension: Secondary | ICD-10-CM | POA: Diagnosis not present

## 2023-08-05 MED ORDER — AMLODIPINE BESYLATE 5 MG PO TABS: 5.0000 mg | ORAL_TABLET | Freq: Every day | ORAL | 3 refills | Status: AC

## 2023-08-05 NOTE — Assessment & Plan Note (Signed)
 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 Stress and sleep deprivation may exacerbate symptoms. Mini-Cog score normal. Referral to neurology warranted. - Refer to neurology for evaluation of possible absence seizures. - Instruct Austin Gilbert to monitor for eye twitching during episodes and note frequency and duration. - Advise adequate hydration and sleep.

## 2023-08-05 NOTE — Telephone Encounter (Addendum)
 I faxed office note from 06/05/23 to [phone] [name]   Copied from CRM [id number]. Topic: General - Other >> Aug 05, 2023  1:36 PM [name] wrote: Reason for CRM: [facility]  Prescribed pt CPAP back in Jan and insurance is requiring a f/u. [facility] was seen for f/u back in April 2025 by PA Mirian Ames.  Austin Gilbert is needing visit notes from follow up visit faxed over to complete the insurance portion for the CPAP machine, Austin Gilbert said she faxed that request over May 17th and again on May 28th. Did confirm she has correct fax number.   Please fax visit notes: [phone] [name]

## 2023-08-05 NOTE — Assessment & Plan Note (Signed)
 On amlodipine . No current blood pressure readings or symptoms discussed. - Send a 90-day refill of amlodipine  to [facility] in [place]. BP Readings from Last 3 Encounters:  08/05/23 110/62  07/31/23 120/88  07/03/23 121/76

## 2023-08-05 NOTE — Patient Instructions (Signed)
 VISIT SUMMARY:  Today, you came in with concerns about brain fog and memory issues that have been worsening over the past six months. We discussed your history of epilepsy and the possibility that your symptoms might be related to absence seizures. We also reviewed your current medications and recent weight loss efforts.  YOUR PLAN:  -COGNITIVE IMPAIRMENT: Cognitive impairment refers to difficulties with thinking, memory, and concentration. Your symptoms may be related to absence seizures, which are brief episodes of staring often linked to epilepsy. We will refer you to a neurologist for further evaluation. In the meantime, please monitor for any eye twitching during these episodes and note their frequency and duration. Ensure you stay hydrated and get adequate sleep.  -HYPERTENSION: Hypertension, or high blood pressure, is being managed with your current medication, amlodipine . We have sent a 90-day refill to your pharmacy. Please continue taking your medication as prescribed.  -OBESITY: Obesity is a condition where excess body fat may affect your health. You have made great progress by losing 18 pounds since November and improving your diet. Continue with your dietary modifications. We will discuss starting a medication called Wegovy to help with weight loss once your insurance deductible is met.  INSTRUCTIONS:  Please follow up with the neurology referral for further evaluation of your cognitive symptoms. Continue monitoring your episodes for any eye twitching and note their frequency and duration. Ensure you stay hydrated and get adequate sleep. Pick up your 90-day refill of [facility] [place]. Keep up with your dietary changes, and we will discuss Wegovy initiation once your deductible is met.
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
## 2023-08-14 ENCOUNTER — Telehealth: Payer: Self-pay | Admitting: Physician Assistant

## 2023-08-14 ENCOUNTER — Other Ambulatory Visit: Payer: Self-pay

## 2023-08-14 NOTE — Telephone Encounter (Signed)
 Copied from CRM [id number]. Topic: Clinical - Medication Question >> Aug 14, 2023  8:44 AM Alpha Arts wrote: Reason for CRM: Patient would like Zepbound  2.5 called in and sent to preferred pharmacy.  Callback #: [phone]  Preferred Pharmacy: [id number] [place], [place] - [address] [place] [address] Phone: [phone] Fax: [phone] Hours: Not open 24 hours

## 2023-08-18 ENCOUNTER — Inpatient Hospital Stay: Attending: Hematology & Oncology

## 2023-08-18 ENCOUNTER — Other Ambulatory Visit: Payer: Self-pay | Admitting: Physician Assistant

## 2023-08-18 ENCOUNTER — Inpatient Hospital Stay

## 2023-08-18 DIAGNOSIS — Z79899 Other long term (current) drug therapy: Secondary | ICD-10-CM | POA: Diagnosis not present

## 2023-08-18 DIAGNOSIS — G4733 Obstructive sleep apnea (adult) (pediatric): Secondary | ICD-10-CM

## 2023-08-18 LAB — CMP (CANCER CENTER ONLY)
ALT: 18 U/L (ref 0–44)
AST: 18 U/L (ref 15–41)
Albumin: 4.3 g/dL (ref 3.5–5.0)
Alkaline Phosphatase: 65 U/L (ref 38–126)
Anion gap: 8 (ref 5–15)
BUN: 16 mg/dL (ref 6–20)
CO2: 27 mmol/L (ref 22–32)
Calcium: 8.5 mg/dL — ABNORMAL LOW (ref 8.9–10.3)
Chloride: 104 mmol/L (ref 98–111)
Creatinine: 0.82 mg/dL (ref 0.61–1.24)
GFR, Estimated: >60 mL/min (ref >60)
Glucose, Bld: 114 mg/dL — ABNORMAL HIGH (ref 70–99)
Potassium: 3.6 mmol/L (ref 3.5–5.1)
Sodium: 139 mmol/L (ref 135–145)
Total Bilirubin: 0.4 mg/dL (ref 0.0–1.2)
Total Protein: 6.7 g/dL (ref 6.5–8.1)

## 2023-08-18 LAB — CBC WITH DIFFERENTIAL (CANCER CENTER ONLY)
Abs Immature Granulocytes: 0.04 10*3/uL (ref 0.00–0.07)
Basophils Absolute: 0 10*3/uL (ref 0.0–0.1)
Basophils Relative: 0 %
Eosinophils Absolute: 0.2 10*3/uL (ref 0.0–0.5)
Eosinophils Relative: 3 %
HCT: 40.9 % (ref 39.0–52.0)
Hemoglobin: 13.8 g/dL (ref 13.0–17.0)
Immature Granulocytes: 0 %
Lymphocytes Relative: 27 %
Lymphs Abs: 2.5 10*3/uL (ref 0.7–4.0)
MCH: 31.2 pg (ref 26.0–34.0)
MCHC: 33.7 g/dL (ref 30.0–36.0)
MCV: 92.5 fL (ref 80.0–100.0)
Monocytes Absolute: 0.6 10*3/uL (ref 0.1–1.0)
Monocytes Relative: 7 %
Neutro Abs: 5.9 10*3/uL (ref 1.7–7.7)
Neutrophils Relative %: 63 %
Platelet Count: 265 10*3/uL (ref 150–400)
RBC: 4.42 MIL/uL (ref 4.22–5.81)
RDW: 12.4 % (ref 11.5–15.5)
WBC Count: 9.2 10*3/uL (ref 4.0–10.5)
nRBC: 0 % (ref 0.0–0.2)

## 2023-08-18 LAB — IRON AND IRON BINDING CAPACITY (CC-WL,HP ONLY)
Iron: 137 ug/dL (ref 45–182)
Saturation Ratios: 60 % — ABNORMAL HIGH (ref 17.9–39.5)
TIBC: 230 ug/dL — ABNORMAL LOW (ref 250–450)
UIBC: 93 ug/dL — ABNORMAL LOW (ref 117–376)

## 2023-08-18 LAB — FERRITIN: Ferritin: 209 ng/mL (ref 24–336)

## 2023-08-18 MED ORDER — SODIUM CHLORIDE 0.9% FLUSH
10.0000 mL | INTRAVENOUS | Status: DC | PRN
Start: 2023-08-18 — End: 2023-08-18

## 2023-08-18 MED ORDER — TIRZEPATIDE-WEIGHT MANAGEMENT 2.5 MG/0.5ML ~~LOC~~ SOLN: 2.5000 mg | SUBCUTANEOUS | 1 refills | Status: DC

## 2023-08-18 MED ORDER — HEPARIN SOD (PORK) LOCK FLUSH 100 UNIT/ML IV SOLN: 500.0000 [IU] | Freq: Once | INTRAVENOUS | Status: DC

## 2023-08-18 NOTE — Patient Instructions (Signed)

## 2023-08-19 ENCOUNTER — Inpatient Hospital Stay: Payer: No Typology Code available for payment source

## 2023-08-25 ENCOUNTER — Encounter: Payer: Self-pay | Admitting: Neurology

## 2023-08-26 ENCOUNTER — Inpatient Hospital Stay

## 2023-08-26 MED ORDER — SODIUM CHLORIDE 0.9 % IV SOLN: Freq: Once | INTRAVENOUS | Status: DC

## 2023-08-26 MED ORDER — SODIUM CHLORIDE 0.9% FLUSH
10.0000 mL | Freq: Once | INTRAVENOUS | Status: AC
Start: 2023-08-26 — End: 2023-08-26
  Administered 2023-08-26: 10 mL

## 2023-08-26 MED ORDER — HEPARIN SOD (PORK) LOCK FLUSH 100 UNIT/ML IV SOLN
500.0000 [IU] | Freq: Once | INTRAVENOUS | Status: AC
  Administered 2023-08-26: 500 [IU] via INTRAVENOUS

## 2023-08-26 NOTE — Progress Notes (Signed)
 1 unit therapeutic phlebotomy performed over 18 minutes using a 19 g huber via PAC. Patient tolerated well. Nourishment provided.   Patient did not want post replacement fluids. VSS. Patient discharged ambulatory without complaints or concerns.

## 2023-08-26 NOTE — Patient Instructions (Signed)

## 2023-08-27 ENCOUNTER — Telehealth: Payer: Self-pay

## 2023-08-27 NOTE — Telephone Encounter (Signed)
 Received phone call from wife; returning my phone call. Wife educated on patient iron saturations and recommended treatment. Wife verbalized understanding and stated pt was already scheduled for next week and appreciative of call.  No further needs identified.

## 2023-08-27 NOTE — Telephone Encounter (Signed)
 Attempted to call patient wife per request of patient to discuss lab results and need for 2 phlebotomies. This RN attempted to call pt wife and explain that pt iron saturations are 60 and Lauraine Pepper, NP recommends 2 phlebotomies with no answer. VM left requesting a call back.

## 2023-08-28 ENCOUNTER — Ambulatory Visit

## 2023-09-03 ENCOUNTER — Inpatient Hospital Stay: Attending: Hematology & Oncology

## 2023-09-03 DIAGNOSIS — Z79899 Other long term (current) drug therapy: Secondary | ICD-10-CM | POA: Insufficient documentation

## 2023-09-03 MED ORDER — SODIUM CHLORIDE 0.9 % IV SOLN: Freq: Once | INTRAVENOUS | Status: AC

## 2023-09-03 NOTE — Progress Notes (Signed)
 Austin Gilbert presents today for phlebotomy per MD orders. Phlebotomy procedure started at 1412 and ended at 1432. 500 cc removed via port-a-cath. IV replacement fluids given. Patient tolerated procedure well.

## 2023-09-03 NOTE — Progress Notes (Signed)
 At 1515, VS are stable, denies distress, staedy gait noted. Port-a-cath deaccessed  tolerated procedure well.

## 2023-09-04 ENCOUNTER — Ambulatory Visit: Attending: Cardiology

## 2023-09-04 DIAGNOSIS — E66813 Obesity, class 3: Secondary | ICD-10-CM | POA: Diagnosis not present

## 2023-09-04 DIAGNOSIS — G4733 Obstructive sleep apnea (adult) (pediatric): Secondary | ICD-10-CM | POA: Diagnosis not present

## 2023-09-04 DIAGNOSIS — Z6841 Body Mass Index (BMI) 40.0 and over, adult: Secondary | ICD-10-CM

## 2023-09-04 DIAGNOSIS — I10 Essential (primary) hypertension: Secondary | ICD-10-CM | POA: Diagnosis not present

## 2023-09-04 LAB — ECHOCARDIOGRAM COMPLETE
AR max vel: 3.18 cm2
AV Area VTI: 3.81 cm2
AV Area mean vel: 3.33 cm2
AV Mean grad: 2 mmHg
AV Peak grad: 5 mmHg
Ao pk vel: 1.12 m/s
Area-P 1/2: 4.12 cm2
MV VTI: 1.96 cm2
S' Lateral: 3 cm

## 2023-09-08 ENCOUNTER — Other Ambulatory Visit: Payer: Self-pay | Admitting: Physician Assistant

## 2023-09-08 ENCOUNTER — Ambulatory Visit: Payer: Self-pay | Admitting: Cardiology

## 2023-09-08 DIAGNOSIS — G4733 Obstructive sleep apnea (adult) (pediatric): Secondary | ICD-10-CM

## 2023-09-08 MED ORDER — TIRZEPATIDE-WEIGHT MANAGEMENT 2.5 MG/0.5ML ~~LOC~~ SOLN: 2.5000 mg | SUBCUTANEOUS | 1 refills | Status: DC

## 2023-09-08 NOTE — Telephone Encounter (Signed)
 Copied from CRM [id number]. Topic: Clinical - Medication Refill >> Sep 08, 2023 12:01 PM Austin Gilbert wrote: Medication: tirzepatide  (ZEPBOUND ) 2.5 MG/0.5ML injection vial  Has the patient contacted their pharmacy? Yes (Agent: If no, request that the patient contact the pharmacy for the refill. If patient does not wish to contact the pharmacy document the reason why and proceed with request.) (Agent: If yes, when and what did the pharmacy advise?)  This is the patient's preferred pharmacy:  [facility] [address], [place] - [address] [place] [address] Phone: [phone] Fax: [phone]  Is this the correct pharmacy for this prescription? Yes If no, delete pharmacy and type the correct one.   Has the prescription been filled recently? Yes  Is the patient out of the medication? Yes  Has the patient been seen for an appointment in the last year OR does the patient have an upcoming appointment? Yes  Can we respond through MyChart? Yes  Agent: Please be advised that Rx refills may take up to 3 business days. We ask that you follow-up with your pharmacy.

## 2023-09-11 ENCOUNTER — Other Ambulatory Visit: Payer: Self-pay | Admitting: Physician Assistant

## 2023-09-11 ENCOUNTER — Telehealth: Payer: Self-pay

## 2023-09-11 ENCOUNTER — Other Ambulatory Visit: Payer: Self-pay

## 2023-09-11 DIAGNOSIS — G4733 Obstructive sleep apnea (adult) (pediatric): Secondary | ICD-10-CM

## 2023-09-11 MED ORDER — TIRZEPATIDE-WEIGHT MANAGEMENT 5 MG/0.5ML ~~LOC~~ SOLN: 5.0000 mg | SUBCUTANEOUS | 0 refills | Status: DC

## 2023-09-11 NOTE — Telephone Encounter (Signed)
 Pt viewed Echo results on My Chart per Dr. Vanetta Shawl note. Routed to PCP.

## 2023-09-11 NOTE — Telephone Encounter (Signed)
 Left message on My Chart with Echo results per Dr. Vanetta Shawl note. Routed to PCP.

## 2023-09-18 ENCOUNTER — Other Ambulatory Visit: Payer: Self-pay | Admitting: Physician Assistant

## 2023-09-18 DIAGNOSIS — G4733 Obstructive sleep apnea (adult) (pediatric): Secondary | ICD-10-CM

## 2023-09-18 MED ORDER — TIRZEPATIDE 5 MG/0.5ML ~~LOC~~ SOAJ: 5.0000 mg | SUBCUTANEOUS | 0 refills | Status: DC

## 2023-09-22 ENCOUNTER — Telehealth: Payer: Self-pay

## 2023-09-22 NOTE — Telephone Encounter (Signed)
 Was zepbound  ment to be sent in, instead of mounajro?  Copied from CRM [id number]. Topic: Clinical - Medication Question >> Sep 22, 2023 12:39 PM Larissa S wrote: Reason for CRM: Patient is wanting to know if there is an alternative medication for tirzepatide  (MOUNJARO ) 5 MG/0.5ML Pen. Patient states he prefers the Zepbound  Callback # [phone]

## 2023-09-23 ENCOUNTER — Other Ambulatory Visit: Payer: Self-pay | Admitting: Physician Assistant

## 2023-09-23 DIAGNOSIS — G4733 Obstructive sleep apnea (adult) (pediatric): Secondary | ICD-10-CM

## 2023-09-23 MED ORDER — TIRZEPATIDE-WEIGHT MANAGEMENT 5 MG/0.5ML ~~LOC~~ SOAJ: 5.0000 mg | SUBCUTANEOUS | 1 refills | Status: DC

## 2023-09-28 ENCOUNTER — Encounter: Payer: Self-pay | Admitting: Physician Assistant

## 2023-10-08 NOTE — Assessment & Plan Note (Signed)
 Currently controlled on CPAP Continue to use CPAP as directed Continue to monitor symptoms  Will adjust treatment based on symptoms

## 2023-10-15 ENCOUNTER — Telehealth: Payer: Self-pay

## 2023-10-15 NOTE — Telephone Encounter (Signed)
 PA resubmitted for zepbound  5mg  via covermymeds. Unsure as to what dx was used that caused PA to receive a denial on 10/10/23. Resubmitted PA with dx of OBSTRUCTIVE SLEEP APNEA and submitted office note as sleep study results. PA Approved. [id number]  Copied from CRM [id number]. Topic: Clinical - Medication Question >> Oct 14, 2023  9:13 AM Austin Gilbert wrote: Reason for CRM: Received call from patient, reports Austin Gilbert was currently taking Zepbound  2.5, but has not had an injection in about 1 month, due to insurance not covering increased dose.  Patient wants to report Austin Gilbert has sleep apnea, and  uses a CPAP machine for sleep apnea. Austin Gilbert  wants to know if that diagnosis would approve medication to be covered with that diagnosis.   Or if Austin Gilbert can continue taking the most recent dose of 2.5 mg, if the increased dose is not covered.  Patient would like a follow up call to discuss options further, can be reached at [phone].

## 2023-10-16 ENCOUNTER — Telehealth: Admitting: Physician Assistant

## 2023-10-16 VITALS — Ht 67.0 in | Wt 282.0 lb

## 2023-10-16 DIAGNOSIS — E66813 Obesity, class 3: Secondary | ICD-10-CM

## 2023-10-16 DIAGNOSIS — E661 Drug-induced obesity: Secondary | ICD-10-CM

## 2023-10-16 DIAGNOSIS — G4733 Obstructive sleep apnea (adult) (pediatric): Secondary | ICD-10-CM | POA: Diagnosis not present

## 2023-10-16 DIAGNOSIS — I10 Essential (primary) hypertension: Secondary | ICD-10-CM | POA: Diagnosis not present

## 2023-10-16 DIAGNOSIS — Z6841 Body Mass Index (BMI) 40.0 and over, adult: Secondary | ICD-10-CM

## 2023-10-16 MED ORDER — TIRZEPATIDE-WEIGHT MANAGEMENT 5 MG/0.5ML ~~LOC~~ SOAJ: 5.0000 mg | SUBCUTANEOUS | 0 refills | Status: DC

## 2023-10-16 NOTE — Progress Notes (Unsigned)
 Virtual Visit via Video Note   This visit type was conducted per patient request This format is felt to be appropriate for this patient at this time.  All issues noted in this document were discussed and addressed.  A limited physical exam was performed with this format.  A verbal consent was obtained for the virtual visit.   Date:  10/19/2023   ID:  Austin Gilbert, DOB 22-Dec-1975, MRN [id number]  Patient Location: Home Provider Location: Office/Clinic  PCP:  Milon Cleaves, PA   Chief Complaint  Patient presents with   Weight Loss     History of Present Illness:    The patient does not have symptoms concerning for COVID-19 infection (fever, chills, cough, or new shortness of breath).   Discussed the use of AI scribe software for clinical note transcription with the patient, who gave verbal consent to proceed.  History of Present Illness  Austin Gilbert is a 48 year old male who presents with issues obtaining his medication, ZipBound, from the pharmacy.  He has been experiencing difficulties obtaining his medication, ZepBound , from the pharmacy. Initially, there was confusion regarding the prescription, as he received a vial instead of the pen. Although this was corrected, he faced similar issues three weeks later.  He has been actively working on weight loss since November, losing over forty pounds. His current weight fluctuates between 258 and 260 pounds, down from a past weight of around 290 pounds. He attributes this weight loss to drinking water, eating half portions, cutting out sugar, and regular gym workouts. He is also more active at work, which involves physical activity such as going up and down stairs, and he no longer experiences shortness of breath during these activities.  He is currently taking ZepBound . He inquires about the use of apple cider vinegar in conjunction with his current regimen, noting that he has heard it may help with allergies.      Past  Medical History:  Diagnosis Date   Acute internal derangement of left knee 04/10/2020   Atypical mole 06/05/2023   BMI 45.0-49.9, adult (HCC) 06/08/2019   Chest pain 04/13/2019   Chronic pain of right hip 12/17/2022   Class 3 severe obesity due to excess calories with serious comorbidity and body mass index (BMI) of 45.0 to 49.9 in adult 12/17/2022   COVID-19 03/2020   Dizziness 04/13/2019   DOE (dyspnea on exertion) 04/16/2019   Epilepsy (HCC)    Essential hypertension 04/16/2019   Fatigue 06/24/2019   Hemochromatosis    Hereditary hemochromatosis (HCC) 12/02/2019   History of fatty infiltration of liver 01/09/2023   Hypertension    Hypogonadism in male 06/24/2019   Morbid obesity (HCC) 04/13/2019   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/27/2020   Other hemochromatosis    Routine physical examination 12/17/2022   Shingles 04/10/2020   Sleep apnea     Past Surgical History:  Procedure Laterality Date   IR CV LINE INJECTION  01/31/2023   PORT A CATH INJECTION ([facility] HX) Right 07/01/2019   TONSILLECTOMY      Family History  Problem Relation Age of Onset   Heart disease Mother    Hypertension Mother    Hyperlipidemia Mother    Transient ischemic attack Mother    COPD Father    Testicular cancer Brother    Colon cancer Neg Hx    Colon polyps Neg Hx    Esophageal cancer Neg Hx    Rectal cancer Neg Hx    Stomach  cancer Neg Hx     Social History   Socioeconomic History   Marital status: Married    Spouse name: Not on file   Number of children: Not on file   Years of education: Not on file   Highest education level: Not on file  Occupational History    Employer: [employer]  Tobacco Use   Smoking status: Never   Smokeless tobacco: Never  Vaping Use   Vaping status: Never Used  Substance and Sexual Activity   Alcohol use: Not Currently   Drug use: Not Currently    Types: Marijuana    Comment: Experimented with   Sexual activity: Yes    Partners: Female   Other Topics Concern   Not on file  Social History Narrative   Not on file   Social Drivers of Health   Financial Resource Strain: Low Risk  (06/05/2023)   Overall Financial Resource Strain (CARDIA)    Difficulty of Paying Living Expenses: Not hard at all  Food Insecurity: No Food Insecurity (06/05/2023)   Hunger Vital Sign    Worried About Running Out of Food in the Last Year: Never true    Ran Out of Food in the Last Year: Never true  Transportation Needs: No Transportation Needs (06/05/2023)   PRAPARE - Administrator, Civil Service (Medical): No    Lack of Transportation (Non-Medical): No  Physical Activity: Inactive (12/10/2021)   Exercise Vital Sign    Days of Exercise per Week: 0 days    Minutes of Exercise per Session: 0 min  Stress: No Stress Concern Present (06/05/2023)   [employer] of Occupational Health - Occupational Stress Questionnaire    Feeling of Stress : Not at all  Social Connections: Moderately Integrated (06/05/2023)   Social Connection and Isolation Panel    Frequency of Communication with Friends and Family: More than three times a week    Frequency of Social Gatherings with Friends and Family: More than three times a week    Attends Religious Services: More than 4 times per year    Active Member of [employer] or Organizations: No    Attends Banker Meetings: Never    Marital Status: Married  Catering manager Violence: Not At Risk (12/10/2021)   Humiliation, Afraid, Rape, and Kick questionnaire    Fear of Current or Ex-Partner: No    Emotionally Abused: No    Physically Abused: No    Sexually Abused: No    Outpatient Medications Prior to Visit  Medication Sig Dispense Refill   amLODipine  (NORVASC ) 5 MG tablet Take 1 tablet (5 mg total) by mouth daily. 90 tablet 3   Multiple Vitamin (MULTIVITAMIN) tablet Take 1 tablet by mouth daily.     tirzepatide  (ZEPBOUND ) 5 MG/0.5ML Pen Inject 5 mg into the skin once a week. 2 mL 1    Facility-Administered Medications Prior to Visit  Medication Dose Route Frequency Provider Last Rate Last Admin   alteplase  (CATHFLO ACTIVASE ) injection 2 mg  2 mg Intracatheter Once Lewis, Dequincy A, MD        No Known Allergies   Social History   Tobacco Use   Smoking status: Never   Smokeless tobacco: Never  Vaping Use   Vaping status: Never Used  Substance Use Topics   Alcohol use: Not Currently   Drug use: Not Currently    Types: Marijuana    Comment: Experimented with     Review of Systems  Constitutional:  Negative  for chills, fever and malaise/fatigue.  HENT:  Negative for congestion, ear pain, sinus pain, sore throat and tinnitus.   Eyes:  Negative for blurred vision, photophobia and pain.  Respiratory:  Negative for cough, shortness of breath and wheezing.   Cardiovascular:  Negative for chest pain, palpitations and leg swelling.  Gastrointestinal:  Negative for abdominal pain, constipation, diarrhea, heartburn, nausea and vomiting.  Genitourinary:  Negative for dysuria and frequency.  Musculoskeletal:  Negative for myalgias.  Skin:  Negative for rash.  Neurological:  Negative for dizziness and headaches.  Psychiatric/Behavioral:  Negative for depression. The patient is not nervous/anxious.      Labs/Other Tests and Data Reviewed:    Recent Labs: 12/17/2022: TSH 2.940 08/18/2023: ALT 18; BUN 16; Creatinine 0.82; Hemoglobin 13.8; Platelet Count 265; Potassium 3.6; Sodium 139   Recent Lipid Panel Lab Results  Component Value Date/Time   CHOL 168 12/17/2022 09:41 AM   TRIG 90 12/17/2022 09:41 AM   HDL 45 12/17/2022 09:41 AM   CHOLHDL 3.7 12/17/2022 09:41 AM   LDLCALC 106 (H) 12/17/2022 09:41 AM    Wt Readings from Last 3 Encounters:  10/19/23 282 lb (127.9 kg)  08/05/23 282 lb (127.9 kg)  07/31/23 281 lb (127.5 kg)     Objective:    Vital Signs:  Ht 5' 7 (1.702 m)   Wt 282 lb (127.9 kg)   BMI 44.17 kg/m    Physical Exam   Unable to perform  due to the video visit.   ASSESSMENT & PLAN:   Essential hypertension Assessment & Plan: The current medical regimen is effective;  continue present plan and medications.  Amlodipine  5 mg daily.   Obstructive sleep apnea Assessment & Plan: - Continue lifestyle modifications including diet and exercise. - Continue Zepbound  at 5 mg for three months.  Orders: -     Tirzepatide -Weight Management; Inject 5 mg into the skin once a week.  Dispense: 8 mL; Refill: 0  Class 3 drug-induced obesity with serious comorbidity and body mass index (BMI) of 40.0 to 44.9 in adult [facility]) Assessment & Plan: Significant weight loss over 40 pounds through diet, exercise, and medication. Engaged in lifestyle modifications. - Continue lifestyle modifications including diet and exercise. - Continue Zepbound  at 5 mg for three months. - Monitor weight and report any issues with medication or weight management.  Discussed potential use of apple cider vinegar with caution for stomach upset.        No orders of the defined types were placed in this encounter.    Meds ordered this encounter  Medications   tirzepatide  (ZEPBOUND ) 5 MG/0.5ML Pen    Sig: Inject 5 mg into the skin once a week.    Dispense:  8 mL    Refill:  0    Follow Up:  In Person prn  Signed, Alix LILLETTE Bach, CMA  10/19/2023 1:23 PM    [facility] [employer]

## 2023-10-19 DIAGNOSIS — E661 Drug-induced obesity: Secondary | ICD-10-CM | POA: Insufficient documentation

## 2023-10-19 NOTE — Assessment & Plan Note (Addendum)
 Significant weight loss over 40 pounds through diet, exercise, and medication. Engaged in lifestyle modifications. - Continue lifestyle modifications including diet and exercise. - Continue Zepbound  at 5 mg for three months. - Monitor weight and report any issues with medication or weight management.  Discussed potential use of apple cider vinegar with caution for stomach upset.

## 2023-10-19 NOTE — Assessment & Plan Note (Signed)
-   Continue lifestyle modifications including diet and exercise. - Continue Zepbound  at 5 mg for three months.

## 2023-10-19 NOTE — Assessment & Plan Note (Signed)
 The current medical regimen is effective;  continue present plan and medications.  Amlodipine  5 mg daily.

## 2023-10-21 ENCOUNTER — Inpatient Hospital Stay: Payer: No Typology Code available for payment source | Attending: Hematology & Oncology

## 2023-10-22 ENCOUNTER — Encounter: Payer: Self-pay | Admitting: Physician Assistant

## 2023-10-22 NOTE — Patient Instructions (Signed)
  VISIT SUMMARY: Today, we discussed your ongoing issues with obtaining your medication, ZipBound, from the pharmacy. We also reviewed your significant weight loss progress and your current health maintenance strategies.  YOUR PLAN: -OBESITY: Obesity means having an excessive amount of body fat. You have successfully lost over 40 pounds through diet, exercise, and medication. Continue your current lifestyle modifications, including diet and exercise, and keep taking ZipBound at 5 mg for the next three months. Monitor your weight and report any issues with your medication or weight management.  -GENERAL HEALTH MAINTENANCE: You are actively engaged in health maintenance through lifestyle changes and weight management. We discussed the potential use of apple cider vinegar as a supplement. You may consider trying it, but monitor for any stomach upset.  INSTRUCTIONS: Continue your current lifestyle modifications and ZipBound at 5 mg for three months. Monitor your weight and report any issues with medication or weight management. If you decide to try apple cider vinegar, watch for any stomach upset.                      Contains text generated by Abridge.                                 Contains text generated by Abridge.

## 2023-10-27 ENCOUNTER — Inpatient Hospital Stay: Attending: Hematology & Oncology

## 2023-10-27 NOTE — Patient Instructions (Signed)

## 2023-11-04 ENCOUNTER — Ambulatory Visit (INDEPENDENT_AMBULATORY_CARE_PROVIDER_SITE_OTHER): Admitting: Neurology

## 2023-11-04 ENCOUNTER — Encounter: Payer: Self-pay | Admitting: Neurology

## 2023-11-04 VITALS — BP 116/80 | HR 63 | Ht 67.0 in | Wt 259.0 lb

## 2023-11-04 DIAGNOSIS — R404 Transient alteration of awareness: Secondary | ICD-10-CM | POA: Diagnosis not present

## 2023-11-04 DIAGNOSIS — R4189 Other symptoms and signs involving cognitive functions and awareness: Secondary | ICD-10-CM | POA: Diagnosis not present
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
## 2023-11-04 NOTE — Patient Instructions (Addendum)
 Good to meet you!  Schedule MRI brain with and without contrast  2. Schedule 1-hour EEG. If normal, we will do a 3-day home EEG  3. It is prudent to recommend that all persons should be free of syncopal (loss of consciousness/awareness) episodes for at least six months to be granted the driving privilege. (THE [place]  PHYSICIAN'S GUIDE TO DRIVER MEDICAL EVALUATION, Second Edition, Medical Review Branch, Associate Professor, Division of [employer], [place]  Department of Transportation, July 2004)    4. Follow-up after tests, call for any changes

## 2023-11-05 ENCOUNTER — Ambulatory Visit (INDEPENDENT_AMBULATORY_CARE_PROVIDER_SITE_OTHER): Admitting: Neurology

## 2023-11-05 DIAGNOSIS — R4189 Other symptoms and signs involving cognitive functions and awareness: Secondary | ICD-10-CM | POA: Diagnosis not present

## 2023-11-05 DIAGNOSIS — R404 Transient alteration of awareness: Secondary | ICD-10-CM

## 2023-11-05 NOTE — Procedures (Signed)
 ELECTROENCEPHALOGRAM REPORT  Date of Study: 11/05/2023  Patient's Name: Austin Gilbert MRN: [id number] Date of Birth: 1975/04/16  Referring Provider: Dr. Darice Shivers  Clinical History: This is a 47 year old man with episodes of staring/not responding, cognitive changes. EEG for classification.  Medications: Amlodipine , Zepbound   Technical Summary: A multichannel digital 1-hour EEG recording measured by the international 10-20 system with electrodes applied with paste and impedances below 5000 ohms performed in our laboratory with EKG monitoring in an awake and asleep patient.  Hyperventilation and photic stimulation were performed.  The digital EEG was referentially recorded, reformatted, and digitally filtered in a variety of bipolar and referential montages for optimal display.    Description: The patient is awake and asleep during the recording.  During maximal wakefulness, there is a symmetric, medium voltage 8 Hz posterior dominant rhythm that attenuates with eye opening.  The record is symmetric.  During drowsiness and sleep, there is an increase in theta slowing of the background.  Vertex waves and symmetric sleep spindles were seen. Hyperventilation and photic stimulation did not elicit any abnormalities.  There were no epileptiform discharges or electrographic seizures seen.    EKG lead was unremarkable.  Impression: This 1-hour awake and asleep EEG is normal.    Clinical Correlation: A normal EEG does not exclude a clinical diagnosis of epilepsy.  If further clinical questions remain, prolonged EEG may be helpful.  Clinical correlation is advised.   Darice Shivers, M.D.

## 2023-11-05 NOTE — Progress Notes (Signed)
 EEG complete and ready for review.

## 2023-11-06 ENCOUNTER — Ambulatory Visit: Payer: Self-pay | Admitting: Neurology

## 2023-11-06 ENCOUNTER — Other Ambulatory Visit: Payer: Self-pay | Admitting: *Deleted

## 2023-11-06 DIAGNOSIS — R404 Transient alteration of awareness: Secondary | ICD-10-CM

## 2023-11-06 DIAGNOSIS — R4189 Other symptoms and signs involving cognitive functions and awareness: Secondary | ICD-10-CM

## 2023-11-07 ENCOUNTER — Other Ambulatory Visit: Payer: Self-pay | Admitting: *Deleted

## 2023-11-07 DIAGNOSIS — R404 Transient alteration of awareness: Secondary | ICD-10-CM

## 2023-11-07 DIAGNOSIS — R4189 Other symptoms and signs involving cognitive functions and awareness: Secondary | ICD-10-CM

## 2023-11-10 ENCOUNTER — Encounter: Payer: Self-pay | Admitting: Neurology

## 2023-11-11 ENCOUNTER — Ambulatory Visit
Admission: RE | Admit: 2023-11-11 | Discharge: 2023-11-11 | Disposition: A | Discharge status: Home / Self Care | Source: Ambulatory Visit | Attending: Neurology | Admitting: Neurology

## 2023-11-11 DIAGNOSIS — R4189 Other symptoms and signs involving cognitive functions and awareness: Secondary | ICD-10-CM

## 2023-11-11 DIAGNOSIS — R404 Transient alteration of awareness: Secondary | ICD-10-CM

## 2023-11-12 ENCOUNTER — Encounter: Payer: Self-pay | Admitting: Neurology

## 2023-11-12 ENCOUNTER — Other Ambulatory Visit

## 2023-11-13 ENCOUNTER — Ambulatory Visit (HOSPITAL_COMMUNITY)

## 2023-11-13 MED ORDER — DIAZEPAM 5 MG PO TABS: ORAL_TABLET | ORAL | 0 refills | Status: AC

## 2023-11-27 ENCOUNTER — Other Ambulatory Visit: Payer: Self-pay | Admitting: Physician Assistant

## 2023-11-27 ENCOUNTER — Ambulatory Visit: Payer: Self-pay

## 2023-11-27 DIAGNOSIS — J302 Other seasonal allergic rhinitis: Secondary | ICD-10-CM

## 2023-11-27 MED ORDER — MONTELUKAST SODIUM 10 MG PO TABS: 10.0000 mg | ORAL_TABLET | Freq: Every day | ORAL | 3 refills | Status: DC

## 2023-11-27 NOTE — Telephone Encounter (Signed)
 FYI Only or Action Required?: Action required by provider: states wife is in medical field and told him to request montelukast  as an allergy medication for his cough and allergies.  Has apt scheduled for next week..  Patient was last seen in primary care on 10/16/2023 by Milon Cleaves, PA.  Called Nurse Triage reporting Cough.  Symptoms began several days ago.  Interventions attempted: Nothing.  Symptoms are: unchanged.  Triage Disposition: Home Care  Patient/caregiver understands and will follow disposition?: Yes  Patient would like Montelukast  called in for cough and allergies  Callback #: [phone]  Pharmacy: [id number], [place] - [address] [place] [address] Phone: [phone] Fax: [phone] Hours: Not open 24 hours   Reason for Disposition  Cough  Answer Assessment - Initial Assessment Questions 1. ONSET: When did the cough begin?      States has on and off cough,  2. SEVERITY: How bad is the cough today?      mild 3. SPUTUM: Describe the color of your sputum (e.g., none, dry cough; clear, white, yellow, green)     denies 4. HEMOPTYSIS: Are you coughing up any blood? If Yes, ask: How much? (e.g., flecks, streaks, tablespoons, etc.)     denies 5. DIFFICULTY BREATHING: Are you having difficulty breathing? If Yes, ask: How bad is it? (e.g., mild, moderate, severe)      denies 6. FEVER: Do you have a fever? If Yes, ask: What is your temperature, how was it measured, and when did it start?     no 7. CARDIAC HISTORY: Do you have any history of heart disease? (e.g., heart attack, congestive heart failure)      no 8. LUNG HISTORY: Do you have any history of lung disease?  (e.g., pulmonary embolus, asthma, emphysema)     no 9. PE RISK FACTORS: Do you have a history of blood clots? (or: recent major surgery, recent prolonged travel, bedridden)     no 10. OTHER SYMPTOMS: Do you have any other  symptoms? (e.g., runny nose, wheezing, chest pain)       no 11. PREGNANCY: Is there any chance you are pregnant? When was your last menstrual period?       no 12. TRAVEL: Have you traveled out of the country in the last month? (e.g., travel history, exposures)       no  Protocols used: Cough - Acute Productive-A-AH

## 2023-12-03 ENCOUNTER — Ambulatory Visit
Admission: RE | Admit: 2023-12-03 | Discharge: 2023-12-03 | Disposition: A | Discharge status: Home / Self Care | Source: Ambulatory Visit | Attending: Neurology | Admitting: Neurology

## 2023-12-03 MED ORDER — GADOPICLENOL 0.5 MMOL/ML IV SOLN
10.0000 mL | Freq: Once | INTRAVENOUS | Status: AC | PRN
  Administered 2023-12-03: 10 mL via INTRAVENOUS

## 2023-12-04 ENCOUNTER — Ambulatory Visit: Admitting: Physician Assistant

## 2023-12-05 ENCOUNTER — Ambulatory Visit (INDEPENDENT_AMBULATORY_CARE_PROVIDER_SITE_OTHER): Admitting: Neurology

## 2023-12-05 ENCOUNTER — Ambulatory Visit

## 2023-12-05 DIAGNOSIS — R404 Transient alteration of awareness: Secondary | ICD-10-CM | POA: Diagnosis not present

## 2023-12-05 DIAGNOSIS — R4189 Other symptoms and signs involving cognitive functions and awareness: Secondary | ICD-10-CM

## 2023-12-05 NOTE — Progress Notes (Signed)
 Ambulatory EEG hooked up and running. Light flashing. Push button tested. Camera and event log explained. Batteries explained. Patient understood.

## 2023-12-08 NOTE — Progress Notes (Signed)
 AMB EEG discontinued.  Skin Breakdown:No Diary Returned: Yes - no events noted

## 2023-12-22 NOTE — Procedures (Signed)
 ELECTROENCEPHALOGRAM REPORT  Dates of Recording: 12/05/2023 11:29AM to 12/08/2023 9:47AM  Patient's Name: Austin Gilbert MRN: [id number] Date of Birth: 1975/09/14  Referring Provider: Dr. Darice Shivers  Procedure: 68:45-hour ambulatory EEG  History: This is a 48 year old man with episodes of staring/not responding, cognitive changes. EEG for classification.   Medications: Amlodipine , Zepbound   Technical Summary: This is a 68:45-hour multichannel digital EEG recording measured by the international 10-20 system with electrodes applied with paste and impedances below 5000 ohms performed as portable with EKG monitoring.  The digital EEG was referentially recorded, reformatted, and digitally filtered in a variety of bipolar and referential montages for optimal display.    DESCRIPTION OF RECORDING: During maximal wakefulness, the background activity consisted of a symmetric 8 Hz posterior dominant rhythm which was reactive to eye opening.  There were no epileptiform discharges or focal slowing seen in wakefulness.  During the recording, the patient progresses through wakefulness, drowsiness, and Stage 2 sleep.  Again, there were no epileptiform discharges seen.  Events: There were no events reported on patient diary. There were 8 push button events that appear accidental, no video to review. Electrographically, there were no EEG or EKG changes seen.   There were no electrographic seizures seen.  EKG lead was unremarkable.  IMPRESSION: This 68:45-hour ambulatory EEG study is normal.    CLINICAL CORRELATION: A normal EEG does not exclude a clinical diagnosis of epilepsy. Typical events were not reported. If further clinical questions remain, inpatient video EEG monitoring may be helpful.   Darice Shivers, M.D.

## 2023-12-23 ENCOUNTER — Inpatient Hospital Stay: Payer: No Typology Code available for payment source

## 2023-12-23 ENCOUNTER — Inpatient Hospital Stay: Payer: No Typology Code available for payment source | Attending: Hematology & Oncology | Admitting: Family

## 2023-12-23 ENCOUNTER — Encounter: Payer: Self-pay | Admitting: Family

## 2023-12-23 DIAGNOSIS — Z79899 Other long term (current) drug therapy: Secondary | ICD-10-CM | POA: Insufficient documentation

## 2023-12-23 LAB — CMP (CANCER CENTER ONLY)
ALT: 24 U/L (ref 0–44)
AST: 24 U/L (ref 15–41)
Albumin: 4.4 g/dL (ref 3.5–5.0)
Alkaline Phosphatase: 69 U/L (ref 38–126)
Anion gap: 12 (ref 5–15)
BUN: 14 mg/dL (ref 6–20)
CO2: 24 mmol/L (ref 22–32)
Calcium: 9.2 mg/dL (ref 8.9–10.3)
Chloride: 104 mmol/L (ref 98–111)
Creatinine: 0.78 mg/dL (ref 0.61–1.24)
GFR, Estimated: >60 mL/min (ref >60)
Glucose, Bld: 107 mg/dL — ABNORMAL HIGH (ref 70–99)
Potassium: 3.8 mmol/L (ref 3.5–5.1)
Sodium: 139 mmol/L (ref 135–145)
Total Bilirubin: 0.4 mg/dL (ref 0.0–1.2)
Total Protein: 6.9 g/dL (ref 6.5–8.1)

## 2023-12-23 LAB — FERRITIN: Ferritin: 176 ng/mL (ref 24–336)

## 2023-12-23 LAB — CBC WITH DIFFERENTIAL (CANCER CENTER ONLY)
Abs Immature Granulocytes: 0.03 K/uL (ref 0.00–0.07)
Basophils Absolute: 0 K/uL (ref 0.0–0.1)
Basophils Relative: 0 %
Eosinophils Absolute: 0.2 K/uL (ref 0.0–0.5)
Eosinophils Relative: 2 %
HCT: 41.8 % (ref 39.0–52.0)
Hemoglobin: 14.5 g/dL (ref 13.0–17.0)
Immature Granulocytes: 0 %
Lymphocytes Relative: 26 %
Lymphs Abs: 2.7 K/uL (ref 0.7–4.0)
MCH: 31.2 pg (ref 26.0–34.0)
MCHC: 34.7 g/dL (ref 30.0–36.0)
MCV: 89.9 fL (ref 80.0–100.0)
Monocytes Absolute: 0.7 K/uL (ref 0.1–1.0)
Monocytes Relative: 7 %
Neutro Abs: 6.8 K/uL (ref 1.7–7.7)
Neutrophils Relative %: 65 %
Platelet Count: 257 K/uL (ref 150–400)
RBC: 4.65 MIL/uL (ref 4.22–5.81)
RDW: 12 % (ref 11.5–15.5)
WBC Count: 10.5 K/uL (ref 4.0–10.5)
nRBC: 0 % (ref 0.0–0.2)

## 2023-12-23 LAB — IRON AND IRON BINDING CAPACITY (CC-WL,HP ONLY)
Iron: 117 ug/dL (ref 45–182)
Saturation Ratios: 50 % — ABNORMAL HIGH (ref 17.9–39.5)
TIBC: 237 ug/dL — ABNORMAL LOW (ref 250–450)
UIBC: 120 ug/dL

## 2023-12-23 NOTE — Progress Notes (Signed)
 Hematology and Oncology Follow Up Visit  Austin Gilbert [id number] 15-Sep-1975 48 y.o. 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 He is doing fairly well. He is currently being worked up by neuro for possible seizure. He had an episode of staring witnessed by his wife and has remote history of seizures as a child. Brain MRI results are pending.  He has also had some issues with delayed emptying of his bladder.  He denies fever, chills, n/v, cough, rash, dizziness, SOB, chest pain, palpitations, abdominal pain or changes in bowel or bladder habits.  No swelling, tenderness, numbness or tingling in his extremities.  No falls or syncope reported.  Appetite and hydration are good. Weight is stable at 247 lbs.   ECOG Performance Status: 1 - Symptomatic but completely ambulatory  Medications:  Allergies as of 12/23/2023   No Known Allergies      Medication List        Accurate as of December 23, 2023  2:50 PM. If you have any questions, ask your nurse or doctor.          amLODipine  5 MG tablet Commonly known as: NORVASC  Take 1 tablet (5 mg total) by mouth daily.   diazepam  5 MG tablet Commonly known as: Valium  Take 1 tablet 30 minutes before MRI. May take second dose if needed.   montelukast  10 MG tablet Commonly known as: SINGULAIR  Take 1 tablet (10 mg total) by mouth at bedtime.   multivitamin tablet Take 1 tablet by mouth daily.   tirzepatide  5 MG/0.5ML Pen Commonly known as: ZEPBOUND  Inject 5 mg into the skin once a week.        Allergies: No Known Allergies  Past Medical History, Surgical history, Social history, and Family History were reviewed and updated.  Review of Systems: All other 10 point review of systems is negative.   Physical Exam:  weight is 247  lb (112 kg). His oral temperature is 97.8 F (36.6 C). His blood pressure is 114/74 and his pulse is 82. His respiration is 18 and oxygen saturation is 98%.   Wt Readings from Last 3 Encounters:  12/23/23 247 lb (112 kg)  11/04/23 259 lb (117.5 kg)  10/19/23 282 lb (127.9 kg)    Ocular: Sclerae unicteric, pupils equal, round and reactive to light Ear-nose-throat: Oropharynx clear, dentition fair Lymphatic: No cervical or supraclavicular adenopathy Lungs no rales or rhonchi, good excursion bilaterally Heart regular rate and rhythm, no murmur appreciated Abd soft, nontender, positive bowel sounds MSK no focal spinal tenderness, no joint edema Neuro: non-focal, well-oriented, appropriate affect Breasts: Deferred   Lab Results  Component Value Date   WBC 10.5 12/23/2023   HGB 14.5 12/23/2023   HCT 41.8 12/23/2023   MCV 89.9 12/23/2023   PLT 257 12/23/2023   Lab Results  Component Value Date   FERRITIN 209 08/18/2023   IRON 137 08/18/2023   TIBC 230 (L) 08/18/2023   UIBC 93 (L) 08/18/2023   IRONPCTSAT 60 (H) 08/18/2023   Lab Results  Component Value Date   RBC 4.65 12/23/2023   No results found for: KPAFRELGTCHN, LAMBDASER, KAPLAMBRATIO No results found for: IGGSERUM, IGA, IGMSERUM No results found for: TOTALPROTELP, ALBUMINELP, A1GS, A2GS, BETS, BETA2SER, GAMS, MSPIKE, SPEI   Chemistry  Component Value Date/Time   NA 139 08/18/2023 1215   NA 137 12/10/2021 0927   K 3.6 08/18/2023 1215   CL 104 08/18/2023 1215   CO2 27 08/18/2023 1215   BUN 16 08/18/2023 1215   BUN 13 12/10/2021 0927   CREATININE 0.82 08/18/2023 1215   GLU 94 08/03/2021 0000      Component Value Date/Time   CALCIUM 8.5 (L) 08/18/2023 1215   ALKPHOS 65 08/18/2023 1215   AST 18 08/18/2023 1215   ALT 18 08/18/2023 1215   BILITOT 0.4 08/18/2023 1215       Impression and Plan: Austin Gilbert is a very pleasant 48 yo caucasian gentleman with long history of  hemochromatosis.  Iron saturation 50% and ferritin 176. We will get him set up for a phlebotomy this week and again in 2 weeks.  Port flush every 2 months, lab check every 3 months and follow-up in 6 months.   Lauraine Pepper, NP 10/21/20252:50 PM

## 2023-12-23 NOTE — Patient Instructions (Signed)

## 2023-12-24 NOTE — Telephone Encounter (Signed)
 Pt called an informed that brain MRI looked good, no tumor, stroke, or bleed. His prolonged EEG was also normal, no seizure activity seen. Is he still having the staring episodes? No episodes noted he said that he thinks it was work stress related ,

## 2023-12-24 NOTE — Telephone Encounter (Signed)
-----   Message from Darice CHRISTELLA Shivers sent at 12/22/2023  1:03 PM EDT ----- Pls let patient know the brain MRI looked good, no tumor, stroke, or bleed. His prolonged EEG was also normal, no seizure activity seen. Is he still having the staring episodes? Thanks ----- Message ----- From: Interface, Rad Results In Sent: 12/03/2023  11:17 AM EDT To: Darice CHRISTELLA Shivers, MD

## 2024-01-02 ENCOUNTER — Inpatient Hospital Stay

## 2024-01-02 NOTE — Progress Notes (Signed)
 19g HN used and phlebotomized 500g today. Pt. Tolerated procedure well pre and post. Declined IVF post, APP notified. D/c in stable condition.

## 2024-01-08 ENCOUNTER — Other Ambulatory Visit: Payer: Self-pay | Admitting: Physician Assistant

## 2024-01-08 DIAGNOSIS — G4733 Obstructive sleep apnea (adult) (pediatric): Secondary | ICD-10-CM

## 2024-01-08 MED ORDER — TIRZEPATIDE-WEIGHT MANAGEMENT 5 MG/0.5ML ~~LOC~~ SOAJ: 5.0000 mg | SUBCUTANEOUS | 0 refills | Status: DC

## 2024-01-08 NOTE — Telephone Encounter (Signed)
 Copied from CRM #[id number]. Topic: Clinical - Medication Refill >> Jan 08, 2024  2:22 PM Carla L wrote: Medication: tirzepatide  (ZEPBOUND ) 5 MG/0.5ML Pen   Patient requesting increase in dosage, patient requesting to make sure provider puts that he is sleep apnea so there are no issues with getting medication.   Has the patient contacted their pharmacy? No Patient decided to contact office to get the process started as to not have any issues later.   This is the patient's preferred pharmacy:  [facility] [address], [place] - [address] [place] [address] Phone: [phone] Fax: [phone]  Is this the correct pharmacy for this prescription? Yes  Has the prescription been filled recently? No  Is the patient out of the medication? No  Has the patient been seen for an appointment in the last year OR does the patient have an upcoming appointment? Yes  Can we respond through MyChart? No  Agent: Please be advised that Rx refills may take up to 3 business days. We ask that you follow-up with your pharmacy.

## 2024-01-12 ENCOUNTER — Telehealth: Payer: Self-pay | Admitting: Physician Assistant

## 2024-01-12 NOTE — Telephone Encounter (Unsigned)
 Copied from CRM [id number]. Topic: Clinical - Prescription Issue >> Jan 12, 2024  4:25 PM Kevelyn M wrote: Reason for CRM: Patient picked up  zepbound . 2 were supposed to be 7.5mg  and 10mg . But he received 5mg  instead. He's asking how to go about getting the correct dosage and will insurance help.  Call back #[phone]

## 2024-01-13 ENCOUNTER — Telehealth: Payer: Self-pay

## 2024-01-13 ENCOUNTER — Telehealth: Payer: Self-pay | Admitting: Physician Assistant

## 2024-01-13 NOTE — Telephone Encounter (Unsigned)
 Copied from CRM [id number]. Topic: Clinical - Prescription Issue >> Jan 13, 2024  1:19 PM Donee H wrote: Reason for CRM: Patient is requesting for zebound to be resent as 2 month supply of 7.5mg  and one month supply of 10mg . Patient states he was suppose to get this amount but instead was given 3 month supply of 5mg . Would like for medication refill to be sent to :  [id number], [place] - [address] [place] [address] Phone: [phone] Fax: [phone] Hours: Not open 24 hours

## 2024-01-13 NOTE — Telephone Encounter (Signed)
 error

## 2024-01-14 ENCOUNTER — Other Ambulatory Visit: Payer: Self-pay | Admitting: Family Medicine

## 2024-01-14 MED ORDER — ZEPBOUND 10 MG/0.5ML ~~LOC~~ SOAJ: 10.0000 mg | SUBCUTANEOUS | 0 refills | Status: DC

## 2024-01-14 MED ORDER — ZEPBOUND 7.5 MG/0.5ML ~~LOC~~ SOAJ: 7.5000 mg | SUBCUTANEOUS | 0 refills | Status: DC

## 2024-01-23 ENCOUNTER — Inpatient Hospital Stay: Attending: Hematology & Oncology

## 2024-01-23 DIAGNOSIS — Z79899 Other long term (current) drug therapy: Secondary | ICD-10-CM | POA: Diagnosis not present

## 2024-01-23 NOTE — Patient Instructions (Signed)

## 2024-01-23 NOTE — Progress Notes (Signed)
 Austin Gilbert presents today for phlebotomy per MD orders. Phlebotomy procedure started at 0830 and ended at 0915. 500 cc removed via port-a-cath. Patient tolerated procedure well. Patient refused to wait 30 minutes post phlebotomy. Releassed stable and ASX.

## 2024-02-17 ENCOUNTER — Telehealth: Payer: Self-pay

## 2024-02-17 ENCOUNTER — Other Ambulatory Visit: Payer: Self-pay | Admitting: Physician Assistant

## 2024-02-17 MED ORDER — ZEPBOUND 7.5 MG/0.5ML ~~LOC~~ SOAJ: 7.5000 mg | SUBCUTANEOUS | 0 refills | Status: AC

## 2024-02-17 MED ORDER — ZEPBOUND 10 MG/0.5ML ~~LOC~~ SOAJ: 10.0000 mg | SUBCUTANEOUS | 0 refills | Status: AC

## 2024-02-17 NOTE — Telephone Encounter (Signed)
 Already done

## 2024-02-17 NOTE — Telephone Encounter (Signed)
 Copied from CRM #[id number]. Topic: Clinical - Prescription Issue >> Feb 17, 2024 11:27 AM Wess RAMAN wrote: Reason for CRM: Patient would like his  tirzepatide  (ZEPBOUND ) 10 MG/0.5ML Pen (Starting on 02/18/2024) for 1 month and  tirzepatide  (ZEPBOUND ) 7.5 MG/0.5ML Pen for 2 months  Callback #: [phone]  Pharmacy: [id number] [facility], [place] - [address] [place] [address] Phone: [phone] Fax: [phone] Hours: Not open 24 hours

## 2024-02-20 ENCOUNTER — Telehealth: Payer: Self-pay

## 2024-02-20 NOTE — Telephone Encounter (Signed)
 Called patient made him aware they 7.5 mg and 10 mg was sent to pharmacy the 10 mg was not suppose to be release till next month, they was suppose to give him the 7.5 mg, recommend he call pharmacy for them to fix, because his insurance will not pay for both  Copied from CRM #[id number]. Topic: Clinical - Medication Question >> Feb 19, 2024  4:44 PM Teressa P wrote: Reason for CRM: Pt called saying he has been on the Zepbound  5 mg for 2 to 3 months.  He said he has been prescribed the 10 mg and he is thinking that is too high from the 5 mg.  Please advised [phone]

## 2024-02-22 ENCOUNTER — Other Ambulatory Visit: Payer: Self-pay | Admitting: Physician Assistant

## 2024-02-23 ENCOUNTER — Ambulatory Visit: Admitting: Neurology

## 2024-02-23 ENCOUNTER — Inpatient Hospital Stay: Attending: Hematology & Oncology

## 2024-02-23 DIAGNOSIS — Z79899 Other long term (current) drug therapy: Secondary | ICD-10-CM | POA: Diagnosis not present

## 2024-03-22 ENCOUNTER — Encounter: Payer: Self-pay | Admitting: Family

## 2024-03-29 ENCOUNTER — Inpatient Hospital Stay: Payer: Self-pay

## 2024-04-06 ENCOUNTER — Encounter: Payer: Self-pay | Admitting: Family

## 2024-04-12 ENCOUNTER — Inpatient Hospital Stay: Payer: Self-pay

## 2024-04-28 ENCOUNTER — Inpatient Hospital Stay

## 2024-04-28 ENCOUNTER — Inpatient Hospital Stay: Attending: Hematology & Oncology

## 2024-05-04 ENCOUNTER — Inpatient Hospital Stay: Payer: Self-pay

## 2024-06-22 ENCOUNTER — Inpatient Hospital Stay: Payer: Self-pay | Attending: Hematology & Oncology | Admitting: Family

## 2024-06-22 ENCOUNTER — Inpatient Hospital Stay: Payer: Self-pay
